# Patient Record
Sex: Female | Born: 1990 | Race: White | Hispanic: No | Marital: Married | Smoking: Never smoker
Health system: Southern US, Community
[De-identification: ages and names within clinical notes are randomized; demographics above are authoritative.]

## PROBLEM LIST (undated history)

## (undated) DIAGNOSIS — J309 Allergic rhinitis, unspecified: Secondary | ICD-10-CM

## (undated) DIAGNOSIS — E119 Type 2 diabetes mellitus without complications: Secondary | ICD-10-CM

## (undated) DIAGNOSIS — O24419 Gestational diabetes mellitus in pregnancy, unspecified control: Secondary | ICD-10-CM

## (undated) DIAGNOSIS — M549 Dorsalgia, unspecified: Secondary | ICD-10-CM

## (undated) DIAGNOSIS — F988 Other specified behavioral and emotional disorders with onset usually occurring in childhood and adolescence: Secondary | ICD-10-CM

## (undated) DIAGNOSIS — J45909 Unspecified asthma, uncomplicated: Secondary | ICD-10-CM

## (undated) HISTORY — DX: Type 2 diabetes mellitus without complications: E11.9

## (undated) HISTORY — PX: WISDOM TOOTH EXTRACTION: SHX21

## (undated) HISTORY — PX: NO PAST SURGERIES: SHX2092

## (undated) HISTORY — DX: Allergic rhinitis, unspecified: J30.9

## (undated) HISTORY — DX: Gestational diabetes mellitus in pregnancy, unspecified control: O24.419

## (undated) HISTORY — DX: Dorsalgia, unspecified: M54.9

## (undated) HISTORY — DX: Unspecified asthma, uncomplicated: J45.909

---
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
## 2012-02-28 ENCOUNTER — Other Ambulatory Visit: Payer: Self-pay

## 2012-02-28 MED ORDER — DROSPIRENONE-ETHINYL ESTRADIOL 3-0.03 MG PO TABS: 1.0000 | ORAL_TABLET | Freq: Every day | ORAL | Status: DC

## 2012-02-28 NOTE — Telephone Encounter (Signed)
pts mother called insurance will no longer cover local pharmacy and needs Yasmin transferred to [facility]. Yasmin 3-0.03 mg #30 x 11 sent electronically to [facility]. pts mother advised while on phone.

## 2012-03-02 ENCOUNTER — Other Ambulatory Visit: Payer: Self-pay | Admitting: *Deleted

## 2012-03-02 NOTE — Telephone Encounter (Signed)
Yasmin recently refilled for one [place] with 11 refills, should have been 3 packs with 3 refills.  Form to change this is on your desk.

## 2012-03-03 NOTE — Telephone Encounter (Signed)
Form faxed

## 2012-03-03 NOTE — Telephone Encounter (Signed)
In my box

## 2012-04-27 ENCOUNTER — Encounter: Payer: Self-pay | Admitting: Family Medicine

## 2012-04-27 ENCOUNTER — Ambulatory Visit (INDEPENDENT_AMBULATORY_CARE_PROVIDER_SITE_OTHER): Payer: BC Managed Care – PPO | Admitting: Family Medicine

## 2012-04-27 VITALS — BP 108/70 | HR 80 | Temp 98.1°F | Wt 213.5 lb

## 2012-04-27 DIAGNOSIS — L0291 Cutaneous abscess, unspecified: Secondary | ICD-10-CM | POA: Insufficient documentation

## 2012-04-27 DIAGNOSIS — L0390 Cellulitis, unspecified: Secondary | ICD-10-CM

## 2012-04-27 MED ORDER — SULFAMETHOXAZOLE-TRIMETHOPRIM 800-160 MG PO TABS: 1.0000 | ORAL_TABLET | Freq: Two times a day (BID) | ORAL | Status: AC

## 2012-04-27 NOTE — Patient Instructions (Addendum)
You have an abscess. Please apply warm compresses as many a day as possible. Take antibiotic as directed. Call me immediately- if gets bigger, if you develop a fever, if you nauseated.

## 2012-04-27 NOTE — Progress Notes (Signed)
  Subjective:    Patient ID: Vanessa Merritt, female    DOB: 1991/05/21, 21 y.o.   MRN: [id number]  HPI  21 yo here for ? Infected bug bite on her right inguinal area.  Started approx 3 weeks ago- appeared like a normal bug bite. Over past week or so, getting larger, painful and erythematous. No drainage.  No fevers, no chills, no nausea or vomiting.  She has never had anything like this in past.  Patient Active Problem List  Diagnosis  . MENORRHAGIA  . BACK PAIN, CHRONIC, INTERMITTENT  . Routine general medical examination at a health care facility  . Abscess   Past Medical History  Diagnosis Date  . Back pain    No past surgical history on file. History  Substance Use Topics  . Smoking status: Never Smoker   . Smokeless tobacco: Not on file  . Alcohol Use: No   Family History  Problem Relation Age of Onset  . Hypothyroidism Mother   . Hyperlipidemia Mother   . Hypertension Father    No Known Allergies Current Outpatient Prescriptions on File Prior to Visit  Medication Sig Dispense Refill  . drospirenone-ethinyl estradiol (YASMIN,ZARAH,SYEDA) 3-0.03 MG tablet Take 1 tablet by mouth daily.  1 Package  11   The PMH, PSH, Social History, Family History, Medications, and allergies have been reviewed in [facility], and have been updated if relevant.   Review of Systems See HPI    Objective:   Physical Exam BP 108/70  Pulse 80  Temp 98.1 F (36.7 C) (Oral)  Wt 213 lb 8 oz (96.843 kg)  LMP 04/05/2012 Gen:  Alert pleasant, NAD Skin:  1.5 cm raised, non fluctuant abscess on right inguinal area, mild erythema and warmth.    Assessment & Plan:   1. Abscess    New- non fluctuant so will not I and D today. Start Bactrim DS- 1 tab po twice daily x 7 days. Warm compresses. Discussed red flag symptoms- see pt instructions for details.

## 2012-10-27 ENCOUNTER — Other Ambulatory Visit: Payer: Self-pay | Admitting: Family Medicine

## 2012-10-27 NOTE — Telephone Encounter (Signed)
Pt left vm stating that she was unable to get her yasmin filled at pharmacy.  RX written 02/28/12 x11 refills.  Left message for pt to call back.

## 2013-06-07 ENCOUNTER — Other Ambulatory Visit: Payer: BC Managed Care – PPO

## 2013-06-14 ENCOUNTER — Encounter: Payer: BC Managed Care – PPO | Admitting: Family Medicine

## 2013-08-06 ENCOUNTER — Other Ambulatory Visit: Payer: Self-pay | Admitting: Family Medicine

## 2013-08-06 DIAGNOSIS — Z136 Encounter for screening for cardiovascular disorders: Secondary | ICD-10-CM

## 2013-08-06 DIAGNOSIS — Z0000 Encounter for general adult medical examination without abnormal findings: Secondary | ICD-10-CM

## 2013-08-16 ENCOUNTER — Other Ambulatory Visit (INDEPENDENT_AMBULATORY_CARE_PROVIDER_SITE_OTHER): Payer: BC Managed Care – PPO

## 2013-08-16 DIAGNOSIS — Z136 Encounter for screening for cardiovascular disorders: Secondary | ICD-10-CM

## 2013-08-16 DIAGNOSIS — Z0000 Encounter for general adult medical examination without abnormal findings: Secondary | ICD-10-CM

## 2013-08-16 LAB — CBC WITH DIFFERENTIAL/PLATELET
Basophils Relative: 0.1 % (ref 0.0–3.0)
HCT: 33.6 % — ABNORMAL LOW (ref 36.0–46.0)
Hemoglobin: 11.5 g/dL — ABNORMAL LOW (ref 12.0–15.0)
Lymphocytes Relative: 22.1 % (ref 12.0–46.0)
Monocytes Relative: 8.4 % (ref 3.0–12.0)
Neutro Abs: 7.3 10*3/uL (ref 1.4–7.7)
RBC: 3.99 Mil/uL (ref 3.87–5.11)

## 2013-08-16 LAB — LIPID PANEL
Cholesterol: 173 mg/dL (ref 0–200)
LDL Cholesterol: 113 mg/dL — ABNORMAL HIGH (ref 0–99)
Total CHOL/HDL Ratio: 4

## 2013-08-16 LAB — COMPREHENSIVE METABOLIC PANEL
AST: 21 U/L (ref 0–37)
Albumin: 3.9 g/dL (ref 3.5–5.2)
Alkaline Phosphatase: 73 U/L (ref 39–117)
Calcium: 9.3 mg/dL (ref 8.4–10.5)
Chloride: 106 mEq/L (ref 96–112)
Glucose, Bld: 95 mg/dL (ref 70–99)
Potassium: 3.9 mEq/L (ref 3.5–5.1)
Sodium: 140 mEq/L (ref 135–145)
Total Protein: 7.5 g/dL (ref 6.0–8.3)

## 2013-08-23 ENCOUNTER — Encounter: Payer: BC Managed Care – PPO | Admitting: Family Medicine

## 2014-03-30 ENCOUNTER — Encounter: Payer: Self-pay | Admitting: Family Medicine

## 2014-03-30 ENCOUNTER — Ambulatory Visit (INDEPENDENT_AMBULATORY_CARE_PROVIDER_SITE_OTHER): Payer: BC Managed Care – PPO | Admitting: Family Medicine

## 2014-03-30 VITALS — BP 110/74 | HR 79 | Temp 98.0°F | Ht 66.5 in | Wt 233.0 lb

## 2014-03-30 DIAGNOSIS — M25562 Pain in left knee: Principal | ICD-10-CM

## 2014-03-30 DIAGNOSIS — E669 Obesity, unspecified: Secondary | ICD-10-CM

## 2014-03-30 DIAGNOSIS — M25561 Pain in right knee: Secondary | ICD-10-CM | POA: Insufficient documentation

## 2014-03-30 DIAGNOSIS — M25569 Pain in unspecified knee: Secondary | ICD-10-CM

## 2014-03-30 NOTE — Patient Instructions (Signed)
Great to see you. Trying walking, biking and swimming.  Let me know if you want a referral to a nutritionist.  Call me if things get worse- like the symptoms we talked about.

## 2014-03-30 NOTE — Progress Notes (Signed)
   Subjective:   Patient ID: Vanessa Merritt, female    DOB: 1990-12-14, 23 y.o.   MRN: [id number]  Vanessa Merritt is a pleasant 23 y.o. year old female who presents to clinic today with Knee Pain  on 03/30/2014  HPI: 2 months of bilateral knee locking and popping.  Mainly feels it after she has been sitting for long periods of time.  Does not wake her up at night. No knee redness or swelling. No known injury.  Has gained some weight.  Wt Readings from Last 3 Encounters:  03/30/14 233 lb (105.688 kg)  04/27/12 213 lb 8 oz (96.843 kg) (98%*, Z = 2.13)  04/08/11 208 lb 8 oz (94.575 kg) (98%*, Z = 2.09)   * Growth percentiles are based on CDC 2-20 Years data.    No recent new exercises.  Current Outpatient Prescriptions on File Prior to Visit  Medication Sig Dispense Refill  . drospirenone-ethinyl estradiol (YASMIN,ZARAH,SYEDA) 3-0.03 MG tablet Take 1 tablet by mouth daily.  1 Package  11   No current facility-administered medications on file prior to visit.    No Known Allergies  Past Medical History  Diagnosis Date  . Back pain     No past surgical history on file.  Family History  Problem Relation Age of Onset  . Hypothyroidism Mother   . Hyperlipidemia Mother   . Hypertension Father     History   Social History  . Marital Status: Single    Spouse Name: N/A    Number of Children: N/A  . Years of Education: N/A   Occupational History  . Not on file.   Social History Main Topics  . Smoking status: Never Smoker   . Smokeless tobacco: Not on file  . Alcohol Use: No  . Drug Use: No  . Sexual Activity: Not on file   Other Topics Concern  . Not on file   Social History Narrative   Lives with mom and dad   Wants to go into Elementary education   The PMH, PSH, Social History, Family History, Medications, and allergies have been reviewed in [facility], and have been updated if relevant.   Review of Systems See HPI    Objective:    BP 110/74  Pulse 79   Temp(Src) 98 F (36.7 C) (Oral)  Ht 5' 6.5" (1.689 m)  Wt 233 lb (105.688 kg)  BMI 37.05 kg/m2  SpO2 98%  LMP 03/29/2014 Wt Readings from Last 3 Encounters:  03/30/14 233 lb (105.688 kg)  04/27/12 213 lb 8 oz (96.843 kg) (98%*, Z = 2.13)  04/08/11 208 lb 8 oz (94.575 kg) (98%*, Z = 2.09)   * Growth percentiles are based on CDC 2-20 Years data.      Physical Exam Gen:  Obese, pleasant, NAD MSK: Bilateral knees normal to inspection and palpation No laxity Neg anterior drawer No effusion    Assessment & Plan:   Knee pain, bilateral No Follow-up on file.

## 2014-03-30 NOTE — Assessment & Plan Note (Signed)
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 Discussed exercises and weight loss. Call or return to clinic prn if these symptoms worsen or fail to improve as anticipated. The patient indicates understanding of these issues and agrees with the plan.

## 2014-03-30 NOTE — Assessment & Plan Note (Signed)
Declined nutrition referral.

## 2014-03-30 NOTE — Progress Notes (Signed)
Pre visit review using our clinic review tool, if applicable. No additional management support is needed unless otherwise documented below in the visit note. 

## 2014-04-06 ENCOUNTER — Ambulatory Visit: Payer: BC Managed Care – PPO | Admitting: Family Medicine

## 2014-04-21 ENCOUNTER — Ambulatory Visit (INDEPENDENT_AMBULATORY_CARE_PROVIDER_SITE_OTHER): Payer: BC Managed Care – PPO | Admitting: Family Medicine

## 2014-04-21 ENCOUNTER — Encounter: Payer: Self-pay | Admitting: Family Medicine

## 2014-04-21 VITALS — BP 114/72 | HR 79 | Temp 98.1°F | Ht 66.5 in | Wt 229.2 lb

## 2014-04-21 DIAGNOSIS — Z23 Encounter for immunization: Secondary | ICD-10-CM

## 2014-04-21 DIAGNOSIS — Z136 Encounter for screening for cardiovascular disorders: Secondary | ICD-10-CM

## 2014-04-21 DIAGNOSIS — Z0000 Encounter for general adult medical examination without abnormal findings: Secondary | ICD-10-CM

## 2014-04-21 LAB — LIPID PANEL
CHOL/HDL RATIO: 5
Cholesterol: 185 mg/dL (ref 0–200)
HDL: 38.6 mg/dL — AB (ref >39.00)
LDL CALC: 125 mg/dL — AB (ref 0–99)
NonHDL: 146.4
TRIGLYCERIDES: 105 mg/dL (ref 0.0–149.0)
VLDL: 21 mg/dL (ref 0.0–40.0)

## 2014-04-21 LAB — COMPREHENSIVE METABOLIC PANEL
ALBUMIN: 4.1 g/dL (ref 3.5–5.2)
ALK PHOS: 59 U/L (ref 39–117)
ALT: 17 U/L (ref 0–35)
AST: 17 U/L (ref 0–37)
BILIRUBIN TOTAL: 0.6 mg/dL (ref 0.2–1.2)
BUN: 13 mg/dL (ref 6–23)
CO2: 27 mEq/L (ref 19–32)
Calcium: 9.1 mg/dL (ref 8.4–10.5)
Chloride: 104 mEq/L (ref 96–112)
Creatinine, Ser: 0.6 mg/dL (ref 0.4–1.2)
GFR: 129.29 mL/min (ref >60.00)
Glucose, Bld: 91 mg/dL (ref 70–99)
POTASSIUM: 3.8 meq/L (ref 3.5–5.1)
Sodium: 136 mEq/L (ref 135–145)
Total Protein: 7.4 g/dL (ref 6.0–8.3)

## 2014-04-21 LAB — CBC WITH DIFFERENTIAL/PLATELET
BASOS PCT: 0.3 % (ref 0.0–3.0)
Basophils Absolute: 0 10*3/uL (ref 0.0–0.1)
EOS ABS: 0.2 10*3/uL (ref 0.0–0.7)
Eosinophils Relative: 1.6 % (ref 0.0–5.0)
HCT: 37.3 % (ref 36.0–46.0)
Hemoglobin: 12.6 g/dL (ref 12.0–15.0)
Lymphocytes Relative: 20.1 % (ref 12.0–46.0)
Lymphs Abs: 2 10*3/uL (ref 0.7–4.0)
MCHC: 33.7 g/dL (ref 30.0–36.0)
MCV: 85.3 fl (ref 78.0–100.0)
MONO ABS: 0.7 10*3/uL (ref 0.1–1.0)
Monocytes Relative: 7.6 % (ref 3.0–12.0)
Neutro Abs: 6.9 10*3/uL (ref 1.4–7.7)
Neutrophils Relative %: 70.4 % (ref 43.0–77.0)
PLATELETS: 326 10*3/uL (ref 150.0–400.0)
RBC: 4.37 Mil/uL (ref 3.87–5.11)
RDW: 13 % (ref 11.5–14.6)
WBC: 9.8 10*3/uL (ref 4.5–10.5)

## 2014-04-21 LAB — VITAMIN B12: Vitamin B-12: 315 pg/mL (ref 211–911)

## 2014-04-21 LAB — TSH: TSH: 2.29 u[IU]/mL (ref 0.35–5.50)

## 2014-04-21 NOTE — Assessment & Plan Note (Signed)
Discussed dangers of smoking, alcohol, and drug abuse.  Also discussed sexual activity, pregnancy risk, and STD risk.  Encouraged to get regular exercise.  Declined STD screening.  Gardasil #2 today.  Advised pap after 05/2014.

## 2014-04-21 NOTE — Patient Instructions (Addendum)
Great to see you. You can look up your labs online and you can email me with questions. We will call you with your lab results.

## 2014-04-21 NOTE — Progress Notes (Signed)
Subjective:   Patient ID: Vanessa Merritt, female    DOB: 11/14/1990, 23 y.o.   MRN: [id number]  Vanessa SanesCara N Merritt is a pleasant 23 y.o. year old female who presents to clinic today with Annual Exam  on 04/21/2014  HPI: 23 yo GO here for CPX.  Has never had a pap smear.  Denies any current complaints. Knee pain has improved.  Not currently sexually active.  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  Current Outpatient Prescriptions on File Prior to Visit  Medication Sig Dispense Refill  . doxycycline (DORYX) 75 MG EC tablet Take 75 mg by mouth 2 (two) times daily.      . drospirenone-ethinyl estradiol (YASMIN,ZARAH,SYEDA) 3-0.03 MG tablet Take 1 tablet by mouth daily.  1 Package  11   No current facility-administered medications on file prior to visit.    No Known Allergies  Past Medical History  Diagnosis Date  . Back pain     No past surgical history on file.  Family History  Problem Relation Age of Onset  . Hypothyroidism Mother   . Hyperlipidemia Mother   . Hypertension Father     History   Social History  . Marital Status: Single    Spouse Name: N/A    Number of Children: N/A  . Years of Education: N/A   Occupational History  . Not on file.   Social History Main Topics  . Smoking status: Never Smoker   . Smokeless tobacco: Not on file  . Alcohol Use: No  . Drug Use: No  . Sexual Activity: Not on file   Other Topics Concern  . Not on file   Social History Narrative   Lives with mom and dad   Wants to go into Elementary education   The PMH, PSH, Social History, Family History, Medications, and allergies [facility], and have been updated if relevant.   Review of Systems    See HPI Patient reports no  vision/ hearing changes,anorexia, weight change, fever ,adenopathy, persistant / recurrent hoarseness, swallowing issues, chest pain, edema,persistant / recurrent cough, hemoptysis, dyspnea(rest, exertional, paroxysmal nocturnal),  gastrointestinal  bleeding (melena, rectal bleeding), abdominal pain, excessive heart burn, GU symptoms(dysuria, hematuria, pyuria, voiding/incontinence  Issues) syncope, focal weakness, severe memory loss, concerning skin lesions, depression, anxiety, abnormal bruising/bleeding, major joint swelling, breast masses or abnormal vaginal bleeding.    Objective:    BP 114/72  Pulse 79  Temp(Src) 98.1 F (36.7 C) (Oral)  Ht 5' 6.5" (1.689 m)  Wt 229 lb 4 oz (103.987 kg)  BMI 36.45 kg/m2  SpO2 97%  LMP 03/29/2014  Wt Readings from Last 3 Encounters:  04/21/14 229 lb 4 oz (103.987 kg)  03/30/14 233 lb (105.688 kg)  04/27/12 213 lb 8 oz (96.843 kg) (98%*, Z = 2.13)   * Growth percentiles are based on CDC 2-20 Years data.    Physical Exam    General:  Well-developed,well-nourished,in no acute distress; alert,appropriate and cooperative throughout examination Head:  normocephalic and atraumatic.   Eyes:  vision grossly intact, pupils equal, pupils round, and pupils reactive to light.   Ears:  R ear normal and L ear normal.   Nose:  no external deformity.   Mouth:  good dentition.   Neck:  No deformities, masses, or tenderness noted. Breasts:  No mass, nodules, thickening, tenderness, bulging, retraction, inflamation, nipple discharge or skin changes noted.   Lungs:  Normal respiratory effort, chest expands symmetrically. Lungs are clear to auscultation, no crackles  or wheezes. Heart:  Normal rate and regular rhythm. S1 and S2 normal without gallop, murmur, click, rub or other extra sounds. Abdomen:  Bowel sounds positive,abdomen soft and non-tender without masses, organomegaly or hernias noted. sis noted of thoracic or lumbar spine.   Extremities:  No clubbing, cyanosis, edema, or deformity noted with normal full range of motion of all joints.   Neurologic:  alert & oriented X3 and gait normal.   Skin:  Intact without suspicious lesions or rashes Cervical Nodes:  No lymphadenopathy  noted Axillary Nodes:  No palpable lymphadenopathy Psych:  Cognition and judgment appear intact. Alert and cooperative with normal attention span and concentration. No apparent delusions, illusions, hallucinations      Assessment & Plan:   Routine general medical examination at a health care facility No Follow-up on file.

## 2014-04-21 NOTE — Progress Notes (Signed)
Pre visit review using our clinic review tool, if applicable. No additional management support is needed unless otherwise documented below in the visit note. 

## 2014-04-21 NOTE — Addendum Note (Signed)
Addended by: Desmond DikeKNIGHT, WAYNETTA H on: 04/21/2014 07:56 AM   Modules accepted: Orders

## 2014-04-22 ENCOUNTER — Encounter: Payer: Self-pay | Admitting: *Deleted

## 2014-06-13 ENCOUNTER — Encounter: Payer: Self-pay | Admitting: Gynecology

## 2014-10-11 ENCOUNTER — Ambulatory Visit (INDEPENDENT_AMBULATORY_CARE_PROVIDER_SITE_OTHER): Payer: BC Managed Care – PPO | Admitting: Family Medicine

## 2014-10-11 ENCOUNTER — Encounter: Payer: Self-pay | Admitting: Family Medicine

## 2014-10-11 VITALS — BP 136/76 | HR 78 | Temp 98.5°F | Wt 234.5 lb

## 2014-10-11 DIAGNOSIS — R05 Cough: Secondary | ICD-10-CM

## 2014-10-11 DIAGNOSIS — R053 Chronic cough: Secondary | ICD-10-CM | POA: Insufficient documentation

## 2014-10-11 DIAGNOSIS — R059 Cough, unspecified: Secondary | ICD-10-CM

## 2014-10-11 MED ORDER — ALBUTEROL SULFATE HFA 108 (90 BASE) MCG/ACT IN AERS: 1.0000 | INHALATION_SPRAY | Freq: Four times a day (QID) | RESPIRATORY_TRACT | Status: DC | PRN

## 2014-10-11 MED ORDER — BENZONATATE 200 MG PO CAPS: 200.0000 mg | ORAL_CAPSULE | Freq: Three times a day (TID) | ORAL | Status: DC | PRN

## 2014-10-11 NOTE — Assessment & Plan Note (Signed)
Ctab, looks to be post infectious.  D/w pt.   Use SABA prn, then tessalon if needed. If not improved, then notify us.  She agrees. Nontoxic.

## 2014-10-11 NOTE — Patient Instructions (Signed)
Use the inhaler first and if not better then try tessalon.  If not better at all, then notify us.  This should gradually get better.  Your lungs are clear and it looks like you have a post-infectious cough.

## 2014-10-11 NOTE — Progress Notes (Signed)
Pre visit review using our clinic review tool, if applicable. No additional management support is needed unless otherwise documented below in the visit note.  Coughing since mid October.  She started with a series of allergies/sinus infections, dx'd at student health.  She was put on abx twice.   She had facial pain/pressure but that is resolved.  She had some aches prev, but those are resolved.   Still with cough, but getting better very slowly.  Nights are worse than days with the cough.   Cough is dry.  She prev had sputum but that is resolved.  No fevers now or prev.   Some wheeze prev, not now.   No ST now, just irritated from coughing.  Chest is irritated from coughing.   No ear pain or facial pain now.   Prev with post nasal drip, not now.   H/o childhood asthma, no sx recently.  No inhaler use in years.   Meds, vitals, and allergies reviewed.   ROS: See HPI.  Otherwise, noncontributory.  GEN: nad, alert and oriented HEENT: mucous membranes moist, tm w/o erythema, nasal exam w/o erythema, clear discharge noted,  OP with mild cobblestoning NECK: supple w/o LA CV: rrr.   PULM: ctab, no inc wob, dry cough noted.  EXT: no edema SKIN: no acute rash

## 2015-08-30 ENCOUNTER — Encounter: Payer: Self-pay | Admitting: Family Medicine

## 2015-08-30 ENCOUNTER — Ambulatory Visit (INDEPENDENT_AMBULATORY_CARE_PROVIDER_SITE_OTHER): Payer: BLUE CROSS/BLUE SHIELD | Admitting: Family Medicine

## 2015-08-30 VITALS — BP 122/70 | HR 91 | Temp 98.6°F | Ht 66.75 in | Wt 238.2 lb

## 2015-08-30 DIAGNOSIS — Z0000 Encounter for general adult medical examination without abnormal findings: Secondary | ICD-10-CM | POA: Diagnosis not present

## 2015-08-30 DIAGNOSIS — N920 Excessive and frequent menstruation with regular cycle: Secondary | ICD-10-CM

## 2015-08-30 DIAGNOSIS — E669 Obesity, unspecified: Secondary | ICD-10-CM

## 2015-08-30 LAB — CBC WITH DIFFERENTIAL/PLATELET
Basophils Absolute: 0 10*3/uL (ref 0.0–0.1)
Basophils Relative: 0.5 % (ref 0.0–3.0)
EOS ABS: 0.1 10*3/uL (ref 0.0–0.7)
Eosinophils Relative: 1.4 % (ref 0.0–5.0)
HCT: 37.8 % (ref 36.0–46.0)
HEMOGLOBIN: 12.5 g/dL (ref 12.0–15.0)
Lymphocytes Relative: 17.5 % (ref 12.0–46.0)
Lymphs Abs: 1.3 10*3/uL (ref 0.7–4.0)
MCHC: 33 g/dL (ref 30.0–36.0)
MCV: 84.1 fl (ref 78.0–100.0)
MONO ABS: 0.9 10*3/uL (ref 0.1–1.0)
Monocytes Relative: 12.7 % — ABNORMAL HIGH (ref 3.0–12.0)
NEUTROS PCT: 67.9 % (ref 43.0–77.0)
Neutro Abs: 5 10*3/uL (ref 1.4–7.7)
Platelets: 354 10*3/uL (ref 150.0–400.0)
RBC: 4.49 Mil/uL (ref 3.87–5.11)
RDW: 13 % (ref 11.5–15.5)
WBC: 7.4 10*3/uL (ref 4.0–10.5)

## 2015-08-30 LAB — TSH: TSH: 1.29 u[IU]/mL (ref 0.35–4.50)

## 2015-08-30 LAB — COMPREHENSIVE METABOLIC PANEL
ALK PHOS: 79 U/L (ref 39–117)
ALT: 13 U/L (ref 0–35)
AST: 15 U/L (ref 0–37)
Albumin: 3.9 g/dL (ref 3.5–5.2)
BUN: 8 mg/dL (ref 6–23)
CHLORIDE: 103 meq/L (ref 96–112)
CO2: 27 mEq/L (ref 19–32)
Calcium: 9.3 mg/dL (ref 8.4–10.5)
Creatinine, Ser: 0.72 mg/dL (ref 0.40–1.20)
GFR: 107.42 mL/min (ref >60.00)
GLUCOSE: 78 mg/dL (ref 70–99)
Potassium: 4.3 mEq/L (ref 3.5–5.1)
SODIUM: 136 meq/L (ref 135–145)
TOTAL PROTEIN: 7.4 g/dL (ref 6.0–8.3)
Total Bilirubin: 0.4 mg/dL (ref 0.2–1.2)

## 2015-08-30 LAB — LIPID PANEL
CHOL/HDL RATIO: 5
CHOLESTEROL: 160 mg/dL (ref 0–200)
HDL: 35.4 mg/dL — ABNORMAL LOW (ref >39.00)
LDL CALC: 112 mg/dL — AB (ref 0–99)
NONHDL: 124.27
Triglycerides: 62 mg/dL (ref 0.0–149.0)
VLDL: 12.4 mg/dL (ref 0.0–40.0)

## 2015-08-30 MED ORDER — ALBUTEROL SULFATE HFA 108 (90 BASE) MCG/ACT IN AERS: 1.0000 | INHALATION_SPRAY | Freq: Four times a day (QID) | RESPIRATORY_TRACT | Status: DC | PRN

## 2015-08-30 NOTE — Progress Notes (Signed)
Subjective:   Patient ID: Vanessa Merritt, female    DOB: 02/20/1991, 24 y.o.   MRN: [id number]  Vanessa SanesCara N Merritt is a pleasant 24 y.o. year old female who presents to clinic today with Annual Exam  on 08/30/2015  HPI: 24 yo pleasant G0 here for CPX.  Denies any current complaints. Knee pain has improved.  Not currently sexually active.  Has never had a pap smear.  Does enjoy teaching.  No current outpatient prescriptions on file prior to visit.   No current facility-administered medications on file prior to visit.    No Known Allergies  Past Medical History  Diagnosis Date  . Back pain     No past surgical history on file.  Family History  Problem Relation Age of Onset  . Hypothyroidism Mother   . Hyperlipidemia Mother   . Hypertension Father     Social History   Social History  . Marital Status: Single    Spouse Name: N/A  . Number of Children: N/A  . Years of Education: N/A   Occupational History  . Not on file.   Social History Main Topics  . Smoking status: Never Smoker   . Smokeless tobacco: Not on file  . Alcohol Use: No  . Drug Use: No  . Sexual Activity: Not on file   Other Topics Concern  . Not on file   Social History Narrative   Teacher   The PMH, PSH, Social History, Family History, Medications, and allergies have [facility], and have been updated if relevant.   Review of Systems  Constitutional: Negative.   HENT: Negative.   Respiratory: Negative.   Cardiovascular: Negative.   Gastrointestinal: Negative.   Endocrine: Negative.   Genitourinary: Negative.   Musculoskeletal: Negative.   Skin: Negative.   Allergic/Immunologic: Negative.   Neurological: Negative.   Hematological: Negative.   Psychiatric/Behavioral: Negative.   All other systems reviewed and are negative.       Objective:    BP 122/70 mmHg  Pulse 91  Temp(Src) 98.6 F (37 C) (Oral)  Ht 5' 6.75" (1.695 m)  Wt 238 lb 4 oz (108.069 kg)  BMI 37.62  kg/m2  SpO2 95%  LMP 08/14/2015  Wt Readings from Last 3 Encounters:  08/30/15 238 lb 4 oz (108.069 kg)  10/11/14 234 lb 8 oz (106.369 kg)  04/21/14 229 lb 4 oz (103.987 kg)    Physical Exam    General:  Well-developed,well-nourished,in no acute distress; alert,appropriate and cooperative throughout examination Head:  normocephalic and atraumatic.   Eyes:  vision grossly intact, pupils equal, pupils round, and pupils reactive to light.   Ears:  R ear normal and L ear normal.   Nose:  no external deformity.   Mouth:  good dentition.   Neck:  No deformities, masses, or tenderness noted. Breasts:  No mass, nodules, thickening, tenderness, bulging, retraction, inflamation, nipple discharge or skin changes noted.   Lungs:  Normal respiratory effort, chest expands symmetrically. Lungs are clear to auscultation, no crackles or wheezes. Heart:  Normal rate and regular rhythm. S1 and S2 normal without gallop, murmur, click, rub or other extra sounds. Abdomen:  Bowel sounds positive,abdomen soft and non-tender without masses, organomegaly or hernias noted. sis noted of thoracic or lumbar spine.   Extremities:  No clubbing, cyanosis, edema, or deformity noted with normal full range of motion of all joints.   Neurologic:  alert & oriented X3 and gait normal.   Skin:  Intact without  suspicious lesions or rashes Cervical Nodes:  No lymphadenopathy noted Axillary Nodes:  No palpable lymphadenopathy Psych:  Cognition and judgment appear intact. Alert and cooperative with normal attention span and concentration. No apparent delusions, illusions, hallucinations      Assessment & Plan:   Routine general medical examination at a health care facility  MENORRHAGIA  Obesity, unspecified No Follow-up on file.

## 2015-08-30 NOTE — Assessment & Plan Note (Signed)
Reviewed preventive care protocols, scheduled due services, and updated immunizations Discussed nutrition, exercise, diet, and healthy lifestyle.  Having labs done today.  Declines pap smear today.

## 2015-08-30 NOTE — Progress Notes (Signed)
Pre visit review using our clinic review tool, if applicable. No additional management support is needed unless otherwise documented below in the visit note. 

## 2015-08-30 NOTE — Addendum Note (Signed)
Addended by: Dianne DunARON, TALIA M on: 08/30/2015 12:37 PM   Modules accepted: Orders, SmartSet

## 2015-08-30 NOTE — Patient Instructions (Signed)
Great to see you. Please call Vanessa Merritt back to schedule a pap smear /pelvic exam.

## 2015-08-31 ENCOUNTER — Encounter: Payer: Self-pay | Admitting: *Deleted

## 2015-12-20 ENCOUNTER — Ambulatory Visit (INDEPENDENT_AMBULATORY_CARE_PROVIDER_SITE_OTHER): Payer: BLUE CROSS/BLUE SHIELD | Admitting: *Deleted

## 2015-12-20 ENCOUNTER — Telehealth: Payer: Self-pay | Admitting: Family Medicine

## 2015-12-20 DIAGNOSIS — Z111 Encounter for screening for respiratory tuberculosis: Secondary | ICD-10-CM | POA: Diagnosis not present

## 2015-12-20 NOTE — Telephone Encounter (Signed)
Will defer this to [facility].  Please see below.

## 2015-12-20 NOTE — Telephone Encounter (Signed)
Yes, you are able to schedule her a TB skin test today, if there is are any open nurse visits

## 2015-12-20 NOTE — Telephone Encounter (Signed)
Patient scheduled tb test at 4:00 today.  Patient rescheduled appointment to fill out form to [employer] at 3:45 tomorrow.  Patient couldn't come in at 12:15.

## 2015-12-20 NOTE — Telephone Encounter (Signed)
Patient scheduled appointment for tomorrow with Dr.Aron to get a form filled out for work.  Patient,also,needs a tb test done.  Since patient can't get tb test tomorrow, can she get her tb test done today and have it read on Friday?  Please call patient back this afternoon.

## 2015-12-21 ENCOUNTER — Ambulatory Visit: Payer: BLUE CROSS/BLUE SHIELD | Admitting: Family Medicine

## 2015-12-21 ENCOUNTER — Encounter: Payer: Self-pay | Admitting: Internal Medicine

## 2015-12-21 ENCOUNTER — Ambulatory Visit (INDEPENDENT_AMBULATORY_CARE_PROVIDER_SITE_OTHER): Payer: BLUE CROSS/BLUE SHIELD | Admitting: Internal Medicine

## 2015-12-21 VITALS — BP 128/80 | HR 79 | Temp 98.9°F | Wt 236.0 lb

## 2015-12-21 DIAGNOSIS — Z0289 Encounter for other administrative examinations: Secondary | ICD-10-CM | POA: Diagnosis not present

## 2015-12-21 NOTE — Progress Notes (Signed)
Pre visit review using our clinic review tool, if applicable. No additional management support is needed unless otherwise documented below in the visit note. 

## 2015-12-21 NOTE — Progress Notes (Signed)
Subjective:    Patient ID: Vanessa Merritt, female    DOB: 1990-11-25, 25 y.o.   MRN: [id number]  HPI  Pt presents to the clinic today to have a form filled out for her employer. She had her annual exam with Dr. Dayton Martes 08/2015. She had her TB test done yesterday, and will have it read tomorrow.   Review of Systems      Past Medical History  Diagnosis Date  . Back pain     Current Outpatient Prescriptions  Medication Sig Dispense Refill  . albuterol (PROVENTIL HFA;VENTOLIN HFA) 108 (90 BASE) MCG/ACT inhaler Inhale 1-2 puffs into the lungs every 6 (six) hours as needed (cough). 1 Inhaler 6   No current facility-administered medications for this visit.    No Known Allergies  Family History  Problem Relation Age of Onset  . Hypothyroidism Mother   . Hyperlipidemia Mother   . Hypertension Father     Social History   Social History  . Marital Status: Single    Spouse Name: N/A  . Number of Children: N/A  . Years of Education: N/A   Occupational History  . Not on file.   Social History Main Topics  . Smoking status: Never Smoker   . Smokeless tobacco: Not on file  . Alcohol Use: No  . Drug Use: No  . Sexual Activity: Not on file   Other Topics Concern  . 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  HEENT: Denies eye pain, eye redness, ear pain, ringing in the ears, wax buildup, runny nose, nasal congestion, bloody nose, or sore throat. Respiratory: Denies difficulty breathing, shortness of breath, cough or sputum production.   Cardiovascular: Denies chest pain, chest tightness, palpitations or swelling in the hands or feet.  Gastrointestinal: Denies abdominal pain, bloating, constipation, diarrhea or blood in the stool.  GU: Denies urgency, frequency, pain with urination, burning sensation, blood in urine, odor or discharge. Musculoskeletal: Denies decrease in range of  motion, difficulty with gait, muscle pain or joint pain and swelling.  Skin: Denies redness, rashes, lesions or ulcercations.  Neurological: Denies dizziness, difficulty with memory, difficulty with speech or problems with balance and coordination.  Psych: Denies anxiety, depression, SI/HI.  No other specific complaints in a complete review of systems (except as listed in HPI above).  Objective:   Physical Exam  BP 128/80 mmHg  Pulse 79  Temp(Src) 98.9 F (37.2 C) (Oral)  Wt 236 lb (107.049 kg)  SpO2 98% Wt Readings from Last 3 Encounters:  12/21/15 236 lb (107.049 kg)  08/30/15 238 lb 4 oz (108.069 kg)  10/11/14 234 lb 8 oz (106.369 kg)    General: Appears her stated age, obese in NAD.  Cardiovascular: Normal rate and rhythm. S1,S2 noted.  No murmur, rubs or gallops noted.  Pulmonary/Chest: Normal effort and positive vesicular breath sounds. No respiratory distress. No wheezes, rales or ronchi noted.    BMET    Component Value Date/Time   NA 136 08/30/2015 1238   K 4.3 08/30/2015 1238   CL 103 08/30/2015 1238   CO2 27 08/30/2015 1238   GLUCOSE 78 08/30/2015 1238   BUN 8 08/30/2015 1238   CREATININE 0.72 08/30/2015 1238   CALCIUM 9.3 08/30/2015 1238   GFRNONAA 164.57 09/17/2010 1416    Lipid Panel     Component Value Date/Time   CHOL 160 08/30/2015 1238  TRIG 62.0 08/30/2015 1238   HDL 35.40* 08/30/2015 1238   CHOLHDL 5 08/30/2015 1238   VLDL 12.4 08/30/2015 1238   LDLCALC 112* 08/30/2015 1238    CBC    Component Value Date/Time   WBC 7.4 08/30/2015 1238   RBC 4.49 08/30/2015 1238   HGB 12.5 08/30/2015 1238   HCT 37.8 08/30/2015 1238   PLT 354.0 08/30/2015 1238   MCV 84.1 08/30/2015 1238   MCHC 33.0 08/30/2015 1238   RDW 13.0 08/30/2015 1238   LYMPHSABS 1.3 08/30/2015 1238   MONOABS 0.9 08/30/2015 1238   EOSABS 0.1 08/30/2015 1238   BASOSABS 0.0 08/30/2015 1238    Hgb A1C No results found for: HGBA1C       Assessment & Plan:   Encounter  for form completion with patient:  Form filled out Come back tomorrow to have TB test read  RTC as needed or if symptoms persist or worsen

## 2015-12-22 LAB — TB SKIN TEST
Induration: 0 mm
TB SKIN TEST: NEGATIVE

## 2016-03-16 ENCOUNTER — Ambulatory Visit (INDEPENDENT_AMBULATORY_CARE_PROVIDER_SITE_OTHER): Payer: BLUE CROSS/BLUE SHIELD | Admitting: Family Medicine

## 2016-03-16 ENCOUNTER — Ambulatory Visit: Payer: BLUE CROSS/BLUE SHIELD | Admitting: Family Medicine

## 2016-03-16 ENCOUNTER — Encounter: Payer: Self-pay | Admitting: Family Medicine

## 2016-03-16 VITALS — BP 126/84 | HR 120 | Temp 101.9°F | Wt 246.0 lb

## 2016-03-16 DIAGNOSIS — J020 Streptococcal pharyngitis: Secondary | ICD-10-CM | POA: Diagnosis not present

## 2016-03-16 LAB — POCT RAPID STREP A (OFFICE): RAPID STREP A SCREEN: POSITIVE — AB

## 2016-03-16 LAB — POCT INFLUENZA A/B
Influenza A, POC: NEGATIVE
Influenza B, POC: NEGATIVE

## 2016-03-16 MED ORDER — AMOXICILLIN 500 MG PO TABS: 500.0000 mg | ORAL_TABLET | Freq: Two times a day (BID) | ORAL | Status: DC

## 2016-03-16 NOTE — Progress Notes (Signed)
Pre visit review using our clinic review tool, if applicable. No additional management support is needed unless otherwise documented below in the visit note. 

## 2016-03-16 NOTE — Assessment & Plan Note (Signed)
No acute problem. Rapid strep positive today. Flu negative. Treating with amoxicillin.

## 2016-03-16 NOTE — Patient Instructions (Signed)
Take the medication as prescribed.  Ibuprofen for pain.  Call if you worsen.  Take care  Dr. Adriana [place]ook   Strep Throat Strep throat is a bacterial infection of the throat. Your health care provider may call the infection tonsillitis or pharyngitis, depending on whether there is swelling in the tonsils or at the back of the throat. Strep throat is most common during the cold months of the year in children who are 635-23 years of age, but it can happen during any season in people of any age. This infection is spread from person to person (contagious) through coughing, sneezing, or close contact. CAUSES Strep throat is caused by the bacteria called Streptococcus pyogenes. RISK FACTORS This condition is more likely to develop in:  People who spend time in crowded places where the infection can spread easily.  People who have close contact with someone who has strep throat. SYMPTOMS Symptoms of this condition include:  Fever or chills.   Redness, swelling, or pain in the tonsils or throat.  Pain or difficulty when swallowing.  White or yellow spots on the tonsils or throat.  Swollen, tender glands in the neck or under the jaw.  Red rash all over the body (rare). DIAGNOSIS This condition is diagnosed by performing a rapid strep test or by taking a swab of your throat (throat culture test). Results from a rapid strep test are usually ready in a few minutes, but throat culture test results are available after one or two days. TREATMENT This condition is treated with antibiotic medicine. HOME CARE INSTRUCTIONS Medicines  Take over-the-counter and prescription medicines only as told by your health care provider.  Take your antibiotic as told by your health care provider. Do not stop taking the antibiotic even if you start to feel better.  Have family members who also have a sore throat or fever tested for strep throat. They may need antibiotics if they have the strep infection. Eating  and Drinking  Do not share food, drinking cups, or personal items that could cause the infection to spread to other people.  If swallowing is difficult, try eating soft foods until your sore throat feels better.  Drink enough fluid to keep your urine clear or pale yellow. General Instructions  Gargle with a salt-water mixture 3-4 times per day or as needed. To make a salt-water mixture, completely dissolve -1 tsp of salt in 1 cup of warm water.  Make sure that all household members wash their hands well.  Get plenty of rest.  Stay home from school or work until you have been taking antibiotics for 24 hours.  Keep all follow-up visits as told by your health care provider. This is important. SEEK MEDICAL CARE IF:  The glands in your neck continue to get bigger.  You develop a rash, cough, or earache.  You cough up a thick liquid that is green, yellow-brown, or bloody.  You have pain or discomfort that does not get better with medicine.  Your problems seem to be getting worse rather than better.  You have a fever. SEEK IMMEDIATE MEDICAL CARE IF:  You have new symptoms, such as vomiting, severe headache, stiff or painful neck, chest pain, or shortness of breath.  You have severe throat pain, drooling, or changes in your voice.  You have swelling of the neck, or the skin on the neck becomes red and tender.  You have signs of dehydration, such as fatigue, dry mouth, and decreased urination.  You become increasingly sleepy,  or you cannot wake up completely.  Your joints become red or painful.   This information is not intended to replace advice given to you by your health care provider. Make sure you discuss any questions you have with your health care provider.   Document Released: 10/11/2000 Document Revised: 07/05/2015 Document Reviewed: 02/06/2015 Elsevier Interactive Patient Education [employer]

## 2016-03-16 NOTE — Progress Notes (Signed)
Subjective:  Patient ID: Vanessa Merritt, female    DOB: 02/19/1991  Age: 25 y.o. MRN: [id number]  CC: Sore throat, body, fever, chills, ear pain  HPI:  25 year old female presents with the above complaints.  Patient states that she has not been feeling well since last night. She states that she suddenly developed severe sore throat, body aches, chills, fever, and bilateral ear pain. 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 She's taken over-the-counter ibuprofen and cold and sinus medication with no relief. No known exacerbating factors. She is concerned about underlying infection. No other complaint at this time.  Social Hx   Social History   Social History  . Marital Status: Single    Spouse Name: N/A  . Number of Children: N/A  . Years of Education: N/A   Social History Main Topics  . Smoking status: Never Smoker   . Smokeless tobacco: None  . Alcohol Use: No  . Drug Use: No  . Sexual Activity: Not Asked   Other Topics Concern  . None   Social History Narrative   Teacher   Review of Systems  Constitutional: Positive for fever, chills and fatigue.  HENT: Positive for ear pain and sore throat.   Musculoskeletal:       Body aches.   Objective:  BP 126/84 mmHg  Pulse 120  Temp(Src) 101.9 F (38.8 C) (Oral)  Wt 246 lb (111.585 kg)  SpO2 99%  LMP 03/06/2016  BP/Weight 03/16/2016 12/21/2015 08/30/2015  Systolic BP 126 128 122  Diastolic BP 84 80 70  Wt. (Lbs) 246 236 238.25  BMI 38.84 37.26 37.62    Physical Exam  Constitutional: She is oriented to person, place, and time.  Appears sick but in no apparent distress.  HENT:  Head: Normocephalic and atraumatic.  Oropharynx - moderate to severe erythema with exudate. Normal TMs bilaterally.  Eyes: Conjunctivae are normal.  Cardiovascular:  Tachycardic. Regular rhythm.  Pulmonary/Chest: Effort normal. She has no wheezes. She has no rales.  Lymphadenopathy:    She has cervical  adenopathy.  Neurological: She is alert and oriented to person, place, and time.  Vitals reviewed.   Lab Results  Component Value Date   WBC 7.4 08/30/2015   HGB 12.5 08/30/2015   HCT 37.8 08/30/2015   PLT 354.0 08/30/2015   GLUCOSE 78 08/30/2015   CHOL 160 08/30/2015   TRIG 62.0 08/30/2015   HDL 35.40* 08/30/2015   LDLCALC 112* 08/30/2015   ALT 13 08/30/2015   AST 15 08/30/2015   NA 136 08/30/2015   K 4.3 08/30/2015   CL 103 08/30/2015   CREATININE 0.72 08/30/2015   BUN 8 08/30/2015   CO2 27 08/30/2015   TSH 1.29 08/30/2015   Results for orders placed or performed in visit on 03/16/16 (from the past 24 hour(s))  Rapid Strep A     Status: Abnormal   Collection Time: 03/16/16  1:27 PM  Result Value Ref Range   Rapid Strep A Screen Positive (A) Negative  POCT Influenza A/B     Status: Normal   Collection Time: 03/16/16  1:28 PM  Result Value Ref Range   Influenza A, POC Negative Negative   Influenza B, POC Negative Negative    Assessment & Plan:   Problem List Items Addressed This Visit    Streptococcal pharyngitis - Primary    No acute problem. Rapid strep positive today. Flu negative. Treating with amoxicillin.      Relevant Medications  amoxicillin (AMOXIL) 500 MG tablet   Other Relevant Orders   Rapid Strep A (Completed)   POCT Influenza A/B (Completed)      Meds ordered this encounter  Medications  . amoxicillin (AMOXIL) 500 MG tablet    Sig: Take 1 tablet (500 mg total) by mouth 2 (two) times daily.    Dispense:  20 tablet    Refill:  0    Follow-up: PRN  Everlene Other DO [facility]

## 2016-03-18 ENCOUNTER — Telehealth: Payer: Self-pay

## 2016-03-18 NOTE — Telephone Encounter (Signed)
PLEASE NOTE: All timestamps contained within this report are represented as [place] Time. CONFIDENTIALTY NOTICE: This fax transmission is intended only for the addressee. It contains information that is legally privileged, confidential or otherwise protected from use or disclosure. If you are not the intended recipient, you are strictly prohibited from reviewing, disclosing, copying using or disseminating any of this information or taking any action in reliance on or regarding this information. If you have received this fax in error, please notify us immediately by telephone so that we can arrange for its return to us. Phone: [phone], Toll-Free: [phone], Fax: [phone] Page: 1 of 1 Call Id: [id number] [facility] - Client Nonclinical Telephone Record [facility] Day Contact Type Call Who Is Calling Patient / Member / Family / Caregiver Caller Name Vanessa Merritt Caller Phone Number [phone] Patient Name Vanessa Merritt Call Type Message Only Information Provided Reason for Call Request for General Office Information Initial Comment Caller states she was triaged and referred to Saturday morning clinic. The number that the RN gave her is going straight to voicemail. Caller hung up while I was on hold trying to reach [place] office via backline. Additional Comment Call Closed By: Burt EkAshleigh Brown Transaction Date/Time: 03/16/2016 9:06:47 AM (ET)

## 2016-03-18 NOTE — Telephone Encounter (Signed)
PLEASE NOTE: All timestamps contained within this report are represented as [place] Standard Time. CONFIDENTIALTY NOTICE: This fax transmission is intended only for the addressee. It contains information that is legally privileged, confidential or otherwise protected from use or disclosure. If you are not the intended recipient, you are strictly prohibited from reviewing, disclosing, copying using or disseminating any of this information or taking any action in reliance on or regarding this information. If you have received this fax in error, please notify us immediately by telephone so that we can arrange for its return to us. Phone: [phone], Toll-Free: [phone], Fax: [phone] Page: 1 of 2 Call Id: [id number] [facility] - Client TELEPHONE ADVICE RECORD [facility] Patient Name: Vanessa FrayCARA JOHNSON Gender: Female DOB: 04/01/1991 Age: [age] Y 9 M 15 D Return Phone Number: [phone] (Primary), [phone] (Secondary) Address: City/State/Zip: [place] Client [facility] Night Physician Ruthe MannanAron, Talia - MD Contact Type Call Who Is Calling Patient / Member / Family / Caregiver Call Type Triage / Clinical Caller Name Margaretmary EddySandy Johnson Relationship To Patient Mother Return Phone Number [phone] (Primary) Chief Complaint Fever (non urgent symptom) (> THREE MONTHS) Reason for Call Symptomatic / Request for Health Information Initial Comment Caller states her daughter has fever and sore throat PreDisposition Call Doctor Translation No Nurse Assessment Nurse: Annye Englisharmon, RN, Angelique Blonderenise Date/Time (Eastern Time): 03/16/2016 8:10:13 AM Confirm and document reason for call. If symptomatic, describe symptoms. You must click the next button to save text entered. ---Caller states her daughter has fever of 99.9, sore throat, right earache Has the patient traveled out of the  country within the last 30 days? ---Not Applicable Does the patient have any new or worsening symptoms? ---Yes Will a triage be completed? ---Yes Related visit to physician within the last 2 weeks? ---Yes Does the PT have any chronic conditions? (i.e. diabetes, asthma, etc.) ---No Is the patient pregnant or possibly pregnant? (Ask all females between the ages of 523-55) ---No Is this a behavioral health or substance abuse call? ---No Guidelines Guideline Title Affirmed Question Affirmed Notes Nurse Date/Time (Eastern Time) Sore Throat SEVERE (e.g., excruciating) throat pain Carmon, RN, Angelique Blonderenise 03/16/2016 8:11:24 AM Disp. Time Lamount Cohen(Eastern Time) Disposition Final User 03/16/2016 8:16:30 AM See Physician within 24 Hours Yes Carmon, RN, Angelique Blonderenise PLEASE NOTE: All timestamps contained within this report are represented as [place] Standard Time. CONFIDENTIALTY NOTICE: This fax transmission is intended only for the addressee. It contains information that is legally privileged, confidential or otherwise protected from use or disclosure. If you are not the intended recipient, you are strictly prohibited from reviewing, disclosing, copying using or disseminating any of this information or taking any action in reliance on or regarding this information. If you have received this fax in error, please notify us immediately by telephone so that we can arrange for its return to us. Phone: [phone], Toll-Free: [phone], Fax: [phone] Page: 2 of 2 Call Id: [id number] Caller Understands: Yes Disagree/Comply: Comply Care Advice Given Per Guideline SEE PHYSICIAN WITHIN 24 HOURS: SORE THROAT - For relief of sore throat: * Sip warm chicken broth or apple juice. * Suck on hard candy or a throat lozenge (OTC). * Gargle with warm salt water four times a day. To make salt water, put 1/2 teaspoon of salt in 8 oz (240 ml) of warm water. PAIN OR FEVER MEDICINES: IBUPROFEN (E.G., MOTRIN, ADVIL): * The most  you should take each  day is 1,200 mg (six 200 mg pills a day), unless your doctor has told you to take more. SOFT DIET: * Eat a soft diet. * Cold drinks, popsicles, and milk shakes are especially good. Avoid citrus fruits. DRINK PLENTY LIQUIDS: * Drink plenty of liquids. This is important to prevent dehydation. CALL BACK IF: * You become worse. CARE ADVICE given per Sore Throat (Adult) guideline. Referrals [facility]

## 2016-03-18 NOTE — Telephone Encounter (Signed)
PLEASE NOTE: All timestamps contained within this report are represented as [place]. CONFIDENTIALTY NOTICE: This fax transmission is intended only for the addressee. It contains information that is legally privileged, confidential or otherwise protected from use or disclosure. If you are not the intended recipient, you are strictly prohibited from reviewing, disclosing, copying using or disseminating any of this information or taking any action in reliance on or regarding this information. If you have received this fax in error, please notify us immediately by telephone so that we can arrange for its return to us. Phone: [phone], Toll-Free: [phone], Fax: [phone] Page: 1 of 2 Call Id: [id number] [facility] Client TELEPHONE ADVICE RECORD [facility] Patient Name: Vanessa FrayCARA JOHNSON Gender: Female DOB: 04/01/1991 Age: [age] Y 9 M 15 D Return Phone Number: [phone] (Primary), [phone] (Secondary) Address: City/State/Zip: [place] Client [facility] Physician Ruthe MannanAron, Talia - MD Contact Type Call Who Is Calling Patient / Member / Family / Caregiver Call Type Triage / Clinical Caller Name Margaretmary EddySandy Johnson Relationship To Patient Mother Return Phone Number [phone] (Primary) Chief Complaint Fever (non urgent symptom) (> THREE MONTHS) Reason for Call Symptomatic / Request for Health Information Initial Comment Caller states her daughter has fever and sore throat PreDisposition Call Doctor Translation No Nurse Assessment Nurse: Annye Englisharmon, RN, Angelique Blonderenise Date/Time (Eastern Time): 03/16/2016 8:10:13 AM Confirm and document reason for call. If symptomatic, describe symptoms. You must click the next button to save text entered. ---Caller states her daughter has fever of 99.9, sore throat, right earache Has the patient traveled out of the  country within the last 30 days? ---Not Applicable Does the patient have any new or worsening symptoms? ---Yes Will a triage be completed? ---Yes Related visit to physician within the last 2 weeks? ---Yes Does the PT have any chronic conditions? (i.e. diabetes, asthma, etc.) ---No Is the patient pregnant or possibly pregnant? (Ask all females between the ages of 5312-55) ---No Is this a behavioral health or substance abuse call? ---No Guidelines Guideline Title Affirmed Question Affirmed Notes Nurse Date/Time (Eastern Time) Sore Throat SEVERE (e.g., excruciating) throat pain Carmon, RN, Angelique Blonderenise 03/16/2016 8:11:24 AM Disp. Time Lamount Cohen(Eastern Time) Disposition Final User 03/16/2016 8:16:30 AM See Physician within 24 Hours Yes Carmon, RN, Angelique Blonderenise PLEASE NOTE: All timestamps contained within this report are represented as [place] Standard Time. CONFIDENTIALTY NOTICE: This fax transmission is intended only for the addressee. It contains information that is legally privileged, confidential or otherwise protected from use or disclosure. If you are not the intended recipient, you are strictly prohibited from reviewing, disclosing, copying using or disseminating any of this information or taking any action in reliance on or regarding this information. If you have received this fax in error, please notify us immediately by telephone so that we can arrange for its return to us. Phone: [phone], Toll-Free: [phone], Fax: [phone] Page: 2 of 2 Call Id: [id number] Caller Understands: Yes Disagree/Comply: Comply Care Advice Given Per Guideline SEE PHYSICIAN WITHIN 24 HOURS: SORE THROAT - For relief of sore throat: * Sip warm chicken broth or apple juice. * Suck on hard candy or a throat lozenge (OTC). * Gargle with warm salt water four times a day. To make salt water, put 1/2 teaspoon of salt in 8 oz (240 ml) of warm water. PAIN OR FEVER MEDICINES: IBUPROFEN (E.G., MOTRIN, ADVIL): * The most  you should take each  day is 1,200 mg (six 200 mg pills a day), unless your doctor has told you to take more. SOFT DIET: * Eat a soft diet. * Cold drinks, popsicles, and milk shakes are especially good. Avoid citrus fruits. DRINK PLENTY LIQUIDS: * Drink plenty of liquids. This is important to prevent dehydation. CALL BACK IF: * You become worse. CARE ADVICE given per Sore Throat (Adult) guideline. Referrals [facility] 

## 2016-03-18 NOTE — Telephone Encounter (Signed)
Pt seen [facility] Sat clinic 03/16/16.

## 2016-06-19 ENCOUNTER — Ambulatory Visit (INDEPENDENT_AMBULATORY_CARE_PROVIDER_SITE_OTHER): Payer: BLUE CROSS/BLUE SHIELD | Admitting: Family Medicine

## 2016-06-19 ENCOUNTER — Encounter: Payer: Self-pay | Admitting: Family Medicine

## 2016-06-19 DIAGNOSIS — J029 Acute pharyngitis, unspecified: Secondary | ICD-10-CM | POA: Diagnosis not present

## 2016-06-19 DIAGNOSIS — R300 Dysuria: Secondary | ICD-10-CM | POA: Insufficient documentation

## 2016-06-19 MED ORDER — CEPHALEXIN 500 MG PO CAPS: 500.0000 mg | ORAL_CAPSULE | Freq: Two times a day (BID) | ORAL | 0 refills | Status: DC

## 2016-06-19 NOTE — Progress Notes (Signed)
   Subjective:   Patient ID: Vanessa Merritt, female    DOB: 06/17/1991, 25 y.o.   MRN: [id number]  Vanessa Merritt is a pleasant 25 y.o. year old female who presents to clinic today with Sore Throat  on 06/19/2016  HPI:  Sore throat- acute onset last night of severe sore throat.  Now nauseated.  No fever but has had chills. Ibuprofen has helped some with pain.  No difficulty swallowing but does have pain with swallowing.  Dysuria- two days of dysuria.  No fevers, back pain, nausea or vomiting.  No current outpatient prescriptions on file prior to visit.   No current facility-administered medications on file prior to visit.     No Known Allergies  Past Medical History:  Diagnosis Date  . Back pain     No past surgical history on file.  Family History  Problem Relation Age of Onset  . Hypothyroidism Mother   . Hyperlipidemia Mother   . Hypertension Father     Social History   Social History  . Marital status: Single    Spouse name: N/A  . Number of children: N/A  . Years of education: N/A   Occupational History  . Not on file.   Social History Main Topics  . Smoking status: Never Smoker  . Smokeless tobacco: Not on file  . Alcohol use No  . Drug use: No  . Sexual activity: Not on file   Other Topics Concern  . Not on file   Social History Narrative   Teacher   The PMH, PSH, Social History, Family History, Medications, and allergies [facility], and have been updated if relevant.   Review of Systems  Constitutional: Positive for fatigue. Negative for fever.  HENT: Positive for sore throat. Negative for congestion, dental problem, drooling, ear discharge, ear pain, facial swelling, hearing loss, mouth sores, nosebleeds, postnasal drip, rhinorrhea, sinus pressure, sneezing, tinnitus, trouble swallowing and voice change.   Eyes: Negative.   Respiratory: Negative.   Gastrointestinal: Positive for nausea. Negative for vomiting.  Genitourinary:  Positive for dysuria and frequency. Negative for flank pain.  Musculoskeletal: Negative.   Neurological: Negative.   All other systems reviewed and are negative.      Objective:    BP 118/70   Pulse 90   Temp 98 F (36.7 C) (Oral)   Wt 247 lb (112 kg)   LMP 06/03/2016 (Approximate)   SpO2 97%   BMI 38.98 kg/m    Physical Exam  Constitutional: She is oriented to person, place, and time. She appears well-developed and well-nourished. No distress.  HENT:  Head: Normocephalic.  Right Ear: Hearing and tympanic membrane normal.  Left Ear: Hearing and tympanic membrane normal.  Mouth/Throat: Oropharyngeal exudate present.  Eyes: Conjunctivae are normal.  Cardiovascular: Normal rate.   Pulmonary/Chest: Effort normal.  Abdominal: Soft. She exhibits no distension. There is no tenderness. There is no CVA tenderness.  Musculoskeletal: Normal range of motion.  Neurological: She is alert and oriented to person, place, and time. No cranial nerve deficit.  Skin: Skin is warm and dry. She is not diaphoretic.  Psychiatric: She has a normal mood and affect. Her behavior is normal. Judgment and thought content normal.  Nursing note and vitals reviewed.         Assessment & Plan:   Dysuria  Sore throat No Follow-up on file.

## 2016-06-19 NOTE — Progress Notes (Signed)
Pre visit review using our clinic review tool, if applicable. No additional management support is needed unless otherwise documented below in the visit note. 

## 2016-06-19 NOTE — Patient Instructions (Signed)
Great to see you. Please take Keflex as directed- 1 tablet twice daily x 10 days.  We will call you with your urine culture from today.  Ok to continue taking Ibuprofen as needed for pain/swelling.

## 2016-06-19 NOTE — Assessment & Plan Note (Signed)
Rapid strep neg but exam and history consistent with strep pharyngitis. Treat with Keflex 500 mg twice daily x 10 days to cover possible UTI. Send urine for cx. The patient indicates understanding of these issues and agrees with the plan.

## 2016-06-19 NOTE — Addendum Note (Signed)
Addended by: Desmond DikeKNIGHT, Brendi Mccarroll H on: 06/19/2016 12:20 PM   Modules accepted: Orders

## 2016-06-19 NOTE — Assessment & Plan Note (Signed)
New- Keflex 500 mg twice daily x 10 days. Send urine for cx.

## 2016-06-21 LAB — URINE CULTURE: Organism ID, Bacteria: 10000

## 2017-07-08 ENCOUNTER — Ambulatory Visit (INDEPENDENT_AMBULATORY_CARE_PROVIDER_SITE_OTHER): Payer: BC Managed Care – PPO | Admitting: Internal Medicine

## 2017-07-08 ENCOUNTER — Encounter: Payer: Self-pay | Admitting: Internal Medicine

## 2017-07-08 VITALS — BP 120/84 | HR 77 | Temp 98.1°F | Wt 262.0 lb

## 2017-07-08 DIAGNOSIS — J208 Acute bronchitis due to other specified organisms: Secondary | ICD-10-CM | POA: Diagnosis not present

## 2017-07-08 DIAGNOSIS — B351 Tinea unguium: Secondary | ICD-10-CM | POA: Diagnosis not present

## 2017-07-08 MED ORDER — AZITHROMYCIN 250 MG PO TABS: ORAL_TABLET | ORAL | 0 refills | Status: DC

## 2017-07-08 MED ORDER — HYDROCODONE-HOMATROPINE 5-1.5 MG/5ML PO SYRP: 5.0000 mL | ORAL_SOLUTION | Freq: Three times a day (TID) | ORAL | 0 refills | Status: DC | PRN

## 2017-07-08 NOTE — Patient Instructions (Signed)

## 2017-07-08 NOTE — Progress Notes (Signed)
Subjective:    Patient ID: Vanessa Merritt, female    DOB: 04/12/1991, 26 y.o.   MRN: [id number]  HPI  Pt presents to the clinic today with c/o nasal congestion and cough. This started a few months ago, but got worse over the last 4 days. She is not able to blow anything out of her nose. The cough is productive of clear/white mucous. She denies runny nose, ear pain, sore throat or shortness of breath. She has tried Mucinex, Sudafed, Saline nasal spray, Zyrtec and cough syrup with minimal relief. She has not had sick contacts that she is aware of.   She also reports fungal infection of nail of right middle finger. She has not put anything on it. She reports the nail is starting to lift up and is painful.  Review of Systems      Past Medical History:  Diagnosis Date  . Back pain     Current Outpatient Prescriptions  Medication Sig Dispense Refill  . azithromycin (ZITHROMAX) 250 MG tablet Take 2 tabs today, then 1 tab daily x 4 days 6 tablet 0  . HYDROcodone-homatropine (HYCODAN) 5-1.5 MG/5ML syrup Take 5 mLs by mouth every 8 (eight) hours as needed for cough. 120 mL 0   No current facility-administered medications for this visit.     No Known Allergies  Family History  Problem Relation Age of Onset  . Hypothyroidism Mother   . Hyperlipidemia Mother   . Hypertension Father     Social History   Social History  . Marital status: Single    Spouse name: N/A  . Number of children: N/A  . Years of education: N/A   Occupational History  . Not on file.   Social History Main Topics  . Smoking status: Never Smoker  . Smokeless tobacco: Never Used  . Alcohol use No  . Drug use: No  . Sexual activity: Not on file   Other Topics Concern  . 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  HEENT: Pt reports nasal congestion. Denies eye pain, eye redness, ear pain, ringing in the ears,  wax buildup, runny nose, bloody nose, or sore throat. Respiratory: Pt reports cough. Denies difficulty breathing, shortness of breath.   Skin: Pt reports fungal infection of nail.  No other specific complaints in a complete review of systems (except as listed in HPI above).  Objective:   Physical Exam  BP 120/84   Pulse 77   Temp 98.1 F (36.7 C) (Oral)   Wt 262 lb (118.8 kg)   LMP 07/07/2017   SpO2 98%   BMI 41.34 kg/m  Wt Readings from Last 3 Encounters:  07/08/17 262 lb (118.8 kg)  06/19/16 247 lb (112 kg)  03/16/16 246 lb (111.6 kg)    General: Appears her stated age, in NAD. Skin: Thick, discolored fungal infection of nail on right middle finger. HEENT: Head: normal shape and size, no sinus tenderness noted;  Throat/Mouth: Teeth present, mucosa pink and moist, no exudate, lesions or ulcerations noted.  Neck:  No adenopathy noted. Pulmonary/Chest: Normal effort and scattered rhonchi throughout. No respiratory distress. No wheezes, rales or noted.   BMET    Component Value Date/Time   NA 136 08/30/2015 1238   K 4.3 08/30/2015 1238   CL 103 08/30/2015 1238   CO2 27 08/30/2015 1238   GLUCOSE 78 08/30/2015 1238   BUN 8  08/30/2015 1238   CREATININE 0.72 08/30/2015 1238   CALCIUM 9.3 08/30/2015 1238   GFRNONAA 164.57 09/17/2010 1416    Lipid Panel     Component Value Date/Time   CHOL 160 08/30/2015 1238   TRIG 62.0 08/30/2015 1238   HDL 35.40 (L) 08/30/2015 1238   CHOLHDL 5 08/30/2015 1238   VLDL 12.4 08/30/2015 1238   LDLCALC 112 (H) 08/30/2015 1238    CBC    Component Value Date/Time   WBC 7.4 08/30/2015 1238   RBC 4.49 08/30/2015 1238   HGB 12.5 08/30/2015 1238   HCT 37.8 08/30/2015 1238   PLT 354.0 08/30/2015 1238   MCV 84.1 08/30/2015 1238   MCHC 33.0 08/30/2015 1238   RDW 13.0 08/30/2015 1238   LYMPHSABS 1.3 08/30/2015 1238   MONOABS 0.9 08/30/2015 1238   EOSABS 0.1 08/30/2015 1238   BASOSABS 0.0 08/30/2015 1238    Hgb A1C No results found  for: HGBA1C          Assessment & Plan:   Acute Bronchitis:  Viral Advised her continue Mucinex Start Flonase OTC eRx for Azithromax 250 mg BID to start over the weekend if you start having fever, colored mucous RX for Hycodan for cough  Fungal Infection of Nail:  CMET today If liver enzymes normal, will treat with Terbinafine 250 mg x 12 weeks  Return precautions discussed Nicki ReaperBAITY, Huie Ghuman, NP

## 2017-07-09 ENCOUNTER — Other Ambulatory Visit: Payer: Self-pay | Admitting: Internal Medicine

## 2017-07-09 DIAGNOSIS — B351 Tinea unguium: Secondary | ICD-10-CM

## 2017-07-09 LAB — COMPREHENSIVE METABOLIC PANEL
ALBUMIN: 4.2 g/dL (ref 3.5–5.2)
ALK PHOS: 82 U/L (ref 39–117)
ALT: 14 U/L (ref 0–35)
AST: 15 U/L (ref 0–37)
BILIRUBIN TOTAL: 0.3 mg/dL (ref 0.2–1.2)
BUN: 11 mg/dL (ref 6–23)
CALCIUM: 9.7 mg/dL (ref 8.4–10.5)
CHLORIDE: 103 meq/L (ref 96–112)
CO2: 27 mEq/L (ref 19–32)
CREATININE: 0.7 mg/dL (ref 0.40–1.20)
GFR: 109.17 mL/min (ref >60.00)
Glucose, Bld: 73 mg/dL (ref 70–99)
Potassium: 4.4 mEq/L (ref 3.5–5.1)
Sodium: 138 mEq/L (ref 135–145)
TOTAL PROTEIN: 7.5 g/dL (ref 6.0–8.3)

## 2017-07-09 MED ORDER — TERBINAFINE HCL 250 MG PO TABS: 250.0000 mg | ORAL_TABLET | Freq: Every day | ORAL | 0 refills | Status: DC

## 2017-07-16 ENCOUNTER — Ambulatory Visit (INDEPENDENT_AMBULATORY_CARE_PROVIDER_SITE_OTHER): Payer: BC Managed Care – PPO | Admitting: Internal Medicine

## 2017-07-16 ENCOUNTER — Encounter: Payer: Self-pay | Admitting: Internal Medicine

## 2017-07-16 VITALS — BP 120/78 | HR 83 | Temp 98.2°F | Wt 261.8 lb

## 2017-07-16 DIAGNOSIS — R0982 Postnasal drip: Secondary | ICD-10-CM

## 2017-07-16 DIAGNOSIS — Z9109 Other allergy status, other than to drugs and biological substances: Secondary | ICD-10-CM

## 2017-07-16 MED ORDER — METHYLPREDNISOLONE ACETATE 80 MG/ML IJ SUSP
80.0000 mg | Freq: Once | INTRAMUSCULAR | Status: AC
  Administered 2017-07-16: 80 mg via INTRAMUSCULAR

## 2017-07-16 NOTE — Progress Notes (Signed)
Subjective:    Patient ID: Vanessa Merritt, female    DOB: 1990/11/25, 26 y.o.   MRN: [id number]  HPI  Pt presents to the clinic today to follow up bronchitis. She was seen 1 week ago for the same, treated with Azithromax and Hycodan. She reports persistent sneezing, runny nose and cough. The cough is nonproductive. She denies shortness of breath. She denies fever, chills or body aches. She has been taking Mucinex, Benadryl and Flonase with minimal relief.  Review of Systems      Past Medical History:  Diagnosis Date  . Back pain     Current Outpatient Prescriptions  Medication Sig Dispense Refill  . HYDROcodone-homatropine (HYCODAN) 5-1.5 MG/5ML syrup Take 5 mLs by mouth every 8 (eight) hours as needed for cough. 120 mL 0  . terbinafine (LAMISIL) 250 MG tablet Take 1 tablet (250 mg total) by mouth daily. 30 tablet 0   No current facility-administered medications for this visit.     No Known Allergies  Family History  Problem Relation Age of Onset  . Hypothyroidism Mother   . Hyperlipidemia Mother   . Hypertension Father     Social History   Social History  . Marital status: Single    Spouse name: N/A  . Number of children: N/A  . Years of education: N/A   Occupational History  . Not on file.   Social History Main Topics  . Smoking status: Never Smoker  . Smokeless tobacco: Never Used  . Alcohol use No  . Drug use: No  . Sexual activity: Not on file   Other Topics Concern  . 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  HEENT: Pt reports runny nose. Denies eye pain, eye redness, ear pain, ringing in the ears, wax buildup, nasal congestion, bloody nose, or sore throat. Respiratory: Pt reports cough. Denies difficulty breathing, shortness of breath, or sputum production.   .  No other specific complaints in a complete review of systems (except as listed in HPI  above).  Objective:   Physical Exam  Wt 261 lb 12 oz (118.7 kg)   LMP 07/07/2017   BMI 41.30 kg/m  Wt Readings from Last 3 Encounters:  07/16/17 261 lb 12 oz (118.7 kg)  07/08/17 262 lb (118.8 kg)  06/19/16 247 lb (112 kg)    General: Appears her stated age, well developed, well nourished in NAD. HEENT:  Nose: mucosa pink and moist, septum midline; Throat/Mouth: Teeth present, mucosa erythematous and moist, + PND, no exudate, lesions or ulcerations noted.  Neck:  No adenopathy noted. Pulmonary/Chest: Normal effort and positive vesicular breath sounds. No respiratory distress. No wheezes, rales or ronchi noted.   BMET    Component Value Date/Time   NA 138 07/08/2017 1603   K 4.4 07/08/2017 1603   CL 103 07/08/2017 1603   CO2 27 07/08/2017 1603   GLUCOSE 73 07/08/2017 1603   BUN 11 07/08/2017 1603   CREATININE 0.70 07/08/2017 1603   CALCIUM 9.7 07/08/2017 1603   GFRNONAA 164.57 09/17/2010 1416    Lipid Panel     Component Value Date/Time   CHOL 160 08/30/2015 1238   TRIG 62.0 08/30/2015 1238   HDL 35.40 (L) 08/30/2015 1238   CHOLHDL 5 08/30/2015 1238   VLDL 12.4 08/30/2015 1238   LDLCALC 112 (H) 08/30/2015 1238    CBC    Component Value Date/Time  WBC 7.4 08/30/2015 1238   RBC 4.49 08/30/2015 1238   HGB 12.5 08/30/2015 1238   HCT 37.8 08/30/2015 1238   PLT 354.0 08/30/2015 1238   MCV 84.1 08/30/2015 1238   MCHC 33.0 08/30/2015 1238   RDW 13.0 08/30/2015 1238   LYMPHSABS 1.3 08/30/2015 1238   MONOABS 0.9 08/30/2015 1238   EOSABS 0.1 08/30/2015 1238   BASOSABS 0.0 08/30/2015 1238    Hgb A1C No results found for: HGBA1C          Assessment & Plan:   Cough secondary to PND, Environmental Allergies:  Advised her to continue Zyrtec and Flonase No need for addiitional abx 80 mg Depo IM today Amb ref to allergy per pt request  Return precautions discussed Nicki Reaper, NP

## 2017-07-16 NOTE — Addendum Note (Signed)
Addended by: Roena Malady on: 07/16/2017 04:57 PM   Modules accepted: Orders

## 2017-07-16 NOTE — Patient Instructions (Signed)
Allergic Rhinitis Allergic rhinitis is when the mucous membranes in the nose respond to allergens. Allergens are particles in the air that cause your body to have an allergic reaction. This causes you to release allergic antibodies. Through a chain of events, these eventually cause you to release histamine into the blood stream. Although meant to protect the body, it is this release of histamine that causes your discomfort, such as frequent sneezing, congestion, and an itchy, runny nose. What are the causes? Seasonal allergic rhinitis (hay fever) is caused by pollen allergens that may come from grasses, trees, and weeds. Year-round allergic rhinitis (perennial allergic rhinitis) is caused by allergens such as house dust mites, pet dander, and mold spores. What are the signs or symptoms?  Nasal stuffiness (congestion).  Itchy, runny nose with sneezing and tearing of the eyes. How is this diagnosed? Your health care provider can help you determine the allergen or allergens that trigger your symptoms. If you and your health care provider are unable to determine the allergen, skin or blood testing may be used. Your health care provider will diagnose your condition after taking your health history and performing a physical exam. Your health care provider may assess you for other related conditions, such as asthma, pink eye, or an ear infection. How is this treated? Allergic rhinitis does not have a cure, but it can be controlled by:  Medicines that block allergy symptoms. These may include allergy shots, nasal sprays, and oral antihistamines.  Avoiding the allergen. Hay fever may often be treated with antihistamines in pill or nasal spray forms. Antihistamines block the effects of histamine. There are over-the-counter medicines that may help with nasal congestion and swelling around the eyes. Check with your health care provider before taking or giving this medicine. If avoiding the allergen or the  medicine prescribed do not work, there are many new medicines your health care provider can prescribe. Stronger medicine may be used if initial measures are ineffective. Desensitizing injections can be used if medicine and avoidance does not work. Desensitization is when a patient is given ongoing shots until the body becomes less sensitive to the allergen. Make sure you follow up with your health care provider if problems continue. Follow these instructions at home: It is not possible to completely avoid allergens, but you can reduce your symptoms by taking steps to limit your exposure to them. It helps to know exactly what you are allergic to so that you can avoid your specific triggers. Contact a health care provider if:  You have a fever.  You develop a cough that does not stop easily (persistent).  You have shortness of breath.  You start wheezing.  Symptoms interfere with normal daily activities. This information is not intended to replace advice given to you by your health care provider. Make sure you discuss any questions you have with your health care provider. Document Released: 07/09/2001 Document Revised: 06/14/2016 Document Reviewed: 06/21/2013 Elsevier Interactive Patient Education  2017 [employer]  

## 2017-08-11 ENCOUNTER — Other Ambulatory Visit: Payer: BC Managed Care – PPO

## 2017-08-13 ENCOUNTER — Other Ambulatory Visit (INDEPENDENT_AMBULATORY_CARE_PROVIDER_SITE_OTHER): Payer: BC Managed Care – PPO

## 2017-08-13 DIAGNOSIS — B351 Tinea unguium: Secondary | ICD-10-CM | POA: Diagnosis not present

## 2017-08-14 LAB — COMPREHENSIVE METABOLIC PANEL
ALBUMIN: 4 g/dL (ref 3.5–5.2)
ALK PHOS: 81 U/L (ref 39–117)
ALT: 15 U/L (ref 0–35)
AST: 14 U/L (ref 0–37)
BILIRUBIN TOTAL: 0.3 mg/dL (ref 0.2–1.2)
BUN: 14 mg/dL (ref 6–23)
CO2: 30 mEq/L (ref 19–32)
CREATININE: 0.68 mg/dL (ref 0.40–1.20)
Calcium: 9.3 mg/dL (ref 8.4–10.5)
Chloride: 102 mEq/L (ref 96–112)
GFR: 112.79 mL/min (ref >60.00)
GLUCOSE: 72 mg/dL (ref 70–99)
Potassium: 4.5 mEq/L (ref 3.5–5.1)
SODIUM: 138 meq/L (ref 135–145)
TOTAL PROTEIN: 7.4 g/dL (ref 6.0–8.3)

## 2017-08-15 MED ORDER — TERBINAFINE HCL 250 MG PO TABS: 250.0000 mg | ORAL_TABLET | Freq: Every day | ORAL | 1 refills | Status: DC

## 2017-08-15 NOTE — Addendum Note (Signed)
Addended by: Roena MaladyEVONTENNO, Salah Burlison Y on: 08/15/2017 12:54 PM   Modules accepted: Orders

## 2017-10-29 ENCOUNTER — Ambulatory Visit (INDEPENDENT_AMBULATORY_CARE_PROVIDER_SITE_OTHER): Payer: BC Managed Care – PPO | Admitting: Allergy and Immunology

## 2017-10-29 ENCOUNTER — Encounter: Payer: Self-pay | Admitting: Allergy and Immunology

## 2017-10-29 VITALS — BP 120/90 | HR 100 | Temp 98.2°F | Resp 16 | Ht 67.0 in | Wt 267.2 lb

## 2017-10-29 DIAGNOSIS — J320 Chronic maxillary sinusitis: Secondary | ICD-10-CM

## 2017-10-29 DIAGNOSIS — J3089 Other allergic rhinitis: Secondary | ICD-10-CM

## 2017-10-29 DIAGNOSIS — J4520 Mild intermittent asthma, uncomplicated: Secondary | ICD-10-CM | POA: Diagnosis not present

## 2017-10-29 DIAGNOSIS — R05 Cough: Secondary | ICD-10-CM

## 2017-10-29 DIAGNOSIS — R053 Chronic cough: Secondary | ICD-10-CM

## 2017-10-29 DIAGNOSIS — J329 Chronic sinusitis, unspecified: Secondary | ICD-10-CM | POA: Insufficient documentation

## 2017-10-29 MED ORDER — CARBINOXAMINE MALEATE 6 MG PO TABS: 6.0000 mg | ORAL_TABLET | Freq: Three times a day (TID) | ORAL | 5 refills | Status: DC | PRN

## 2017-10-29 MED ORDER — FLUTICASONE PROPIONATE 93 MCG/ACT NA EXHU: 2.0000 | INHALANT_SUSPENSION | Freq: Two times a day (BID) | NASAL | 5 refills | Status: DC

## 2017-10-29 MED ORDER — ALBUTEROL SULFATE 108 (90 BASE) MCG/ACT IN AEPB: 2.0000 | INHALATION_SPRAY | RESPIRATORY_TRACT | 1 refills | Status: DC | PRN

## 2017-10-29 MED ORDER — MONTELUKAST SODIUM 10 MG PO TABS: 10.0000 mg | ORAL_TABLET | Freq: Every day | ORAL | 5 refills | Status: DC

## 2017-10-29 NOTE — Assessment & Plan Note (Signed)
   Montelukast has been prescribed (as above).  A prescription has been provided for ProAir Respiclick, 1-2 inhalations every 4-6 hours as needed.  Subjective and objective measures of pulmonary function will be followed and the treatment plan will be adjusted accordingly.

## 2017-10-29 NOTE — Progress Notes (Signed)
New Patient Note  RE: Vanessa Merritt MRN: [id number] DOB: 01/11/1991 Date of Office Visit: 10/29/2017  Referring provider: Lorre MunroeBaity, Regina W, NP Primary care provider: Dianne DunAron, Talia M, MD  Chief Complaint: Sinus Problem; Nasal Congestion; and Cough   History of present illness: Vanessa Merritt is a 27 y.o. female seen today in consultation requested by Nicki Reaperegina Baity, NP.  Over the past 3 years she has been experiencing frequent nasal congestion, thick postnasal drainage, throat irritation, sneezing fits, and maxillary sinus pressure, and inner ear pressure.  No significant seasonal symptom variation has been noted nor have specific environmental triggers been identified.  She attempts to control the symptoms with fluticasone nasal spray and guaifenesin without adequate symptom relief.  In addition, she complains that her nasal symptoms often "settle in the chest with bad chest congestion."  She experiences frequent coughing, which seems to originate in the chest, as well as occasional chest tightness, dyspnea, and wheezing.  The cough seems to be worse at nighttime and lingers after all other symptoms from an upper respiratory tract infection have resolved.  She has a history of childhood asthma, however has not carried an albuterol rescue inhaler for many years.   Assessment and plan: Allergic rhinitis  Aeroallergen avoidance measures have been discussed and provided in written form.  A prescription has been provided for RyVent (carbinoxamine maleate) 6mg  every 6-8 hours as needed.  A prescription has been provided for montelukast 10 mg daily at bedtime.  [facility] 2 actuations per nostril twice a day. Proper technique has been discussed and demonstrated.  Nasal saline lavage (NeilMed) has been recommended as needed and prior to medicated nasal sprays along with instructions for proper administration.  If allergen avoidance measures and medications fail to  adequately relieve symptoms, aeroallergen immunotherapy will be considered.  Mild intermittent asthma  Montelukast has been prescribed (as above).  A prescription has been provided for ProAir Respiclick, 1-2 inhalations every 4-6 hours as needed.  Subjective and objective measures of pulmonary function will be followed and the treatment plan will be adjusted accordingly.  Cough, persistent The most common causes of chronic cough include the following: upper airway cough syndrome (UACS) which is caused by variety of rhinosinus conditions; asthma; gastroesophageal reflux disease (GERD); chronic bronchitis from cigarette smoking or other inhaled environmental irritants; non-asthmatic eosinophilic bronchitis; and bronchiectasis. In prospective studies, these conditions have accounted for up to 94% of the causes of chronic cough in immunocompetent adults. The history and physical examination suggest that her cough is multifactorial with contribution from his nasal drainage and bronchial hyperresponsiveness. We will address these issues at this time.   A prescription has been provided for a flutter valve to be used as needed to break the coughing cycle.  Treatment plan as outlined above.    We will regroup in 2 months to assess treatment response and adjust therapy accordingly.   Meds ordered this encounter  Medications  . Carbinoxamine Maleate (RYVENT) 6 MG TABS    Sig: Take 6 mg by mouth every 8 (eight) hours as needed.    Dispense:  30 tablet    Refill:  5    BIN [id number] RX PCN [id number] Group [id number] Cardholder ID  [id number] cash only  . montelukast (SINGULAIR) 10 MG tablet    Sig: Take 1 tablet (10 mg total) by mouth at bedtime.    Dispense:  30 tablet    Refill:  5  . Fluticasone Propionate (XHANCE) 93  MCG/ACT EXHU    Sig: Place 2 puffs into the nose 2 (two) times daily.    Dispense:  16 mL    Refill:  5  . Albuterol Sulfate (PROAIR RESPICLICK) 108 (90 Base) MCG/ACT AEPB    Sig:  Inhale 2 puffs into the lungs every 4 (four) hours as needed.    Dispense:  1 each    Refill:  1    Diagnostics: Spirometry: FVC was 4.07 L and FEV1 was 3.57 L 90 mL with bronchodilator improvement.  Premature asymptomatic Epicutaneous testing: Positive to rabbit and dust mite antigen. Intradermal testing: Positive to cat hair.   Physical examination: Blood pressure 120/90, pulse 100, temperature 98.2 F (36.8 C), temperature source Oral, resp. rate 16, height 5\' 7"  (1.702 m), weight 267 lb 3.2 oz (121.2 kg), SpO2 97 %.  General: Alert, interactive, in no acute distress. HEENT: TMs pearly gray, turbinates edematous with thick discharge, post-pharynx moderately erythematous. Neck: Supple without lymphadenopathy. Lungs: Clear to auscultation without wheezing, rhonchi or rales. CV: Normal S1, S2 without murmurs. Abdomen: Nondistended, nontender. Skin: Warm and dry, without lesions or rashes. Extremities:  No clubbing, cyanosis or edema. Neuro:   Grossly intact.  Review of systems:  Review of systems negative except as noted in HPI / PMHx or noted below: Review of Systems  Constitutional: Negative.   HENT: Negative.   Eyes: Negative.   Respiratory: Negative.   Cardiovascular: Negative.   Gastrointestinal: Negative.   Genitourinary: Negative.   Musculoskeletal: Negative.   Skin: Negative.   Neurological: Negative.   Endo/Heme/Allergies: Negative.   Psychiatric/Behavioral: Negative.     Past medical history:  Past Medical History:  Diagnosis Date  . Back pain   . Childhood asthma     Past surgical history:  History reviewed. No pertinent surgical history.  Family history: Family History  Problem Relation Age of Onset  . Hypothyroidism Mother   . Hyperlipidemia Mother   . Asthma Mother   . Allergic rhinitis Mother   . Hypertension Father   . Angioedema Neg Hx   . Eczema Neg Hx   . Immunodeficiency Neg Hx   . Urticaria Neg Hx     Social history: Social  History   Socioeconomic History  . Marital status: Single    Spouse name: Not on file  . Number of children: Not on file  . Years of education: Not on file  . Highest education level: Not on file  Social Needs  . Financial resource strain: Not on file  . Food insecurity - worry: Not on file  . Food insecurity - inability: Not on file  . Transportation needs - medical: Not on file  . Transportation needs - non-medical: Not on file  Occupational History  . Not on file  Tobacco Use  . Smoking status: Never Smoker  . Smokeless tobacco: Never Used  Substance and Sexual Activity  . Alcohol use: Yes  . Drug use: No  . Sexual activity: Not on file  Other Topics Concern  . Not on file  Social History Narrative   Teacher   Environmental History: The patient lives in an apartment with hardwood floors throughout and central air/heat.  There are 2 rabbits in the apartment which do not have access to her bedroom.  There is no known mold/water damage in the apartment.  She is a non-smoker.  Allergies as of 10/29/2017   No Known Allergies     Medication List        Accurate  as of 10/29/17  6:01 PM. Always use your most recent med list.          Albuterol Sulfate 108 (90 Base) MCG/ACT Aepb Commonly known as:  PROAIR RESPICLICK Inhale 2 puffs into the lungs every 4 (four) hours as needed.   Carbinoxamine Maleate 6 MG Tabs Commonly known as:  RYVENT Take 6 mg by mouth every 8 (eight) hours as needed.   dextromethorphan-guaiFENesin 30-600 MG 12hr tablet Commonly known as:  MUCINEX DM Take 1 tablet by mouth 2 (two) times daily.   diphenhydrAMINE 25 MG tablet Commonly known as:  BENADRYL Take 25 mg by mouth every 6 (six) hours as needed.   fluticasone 50 MCG/ACT nasal spray Commonly known as:  FLONASE Place 1 spray into both nostrils daily.   Fluticasone Propionate 93 MCG/ACT Exhu Commonly known as:  XHANCE Place 2 puffs into the nose 2 (two) times daily.   montelukast 10 MG  tablet Commonly known as:  SINGULAIR Take 1 tablet (10 mg total) by mouth at bedtime.       Known medication allergies: No Known Allergies  I appreciate the opportunity to take part in Floris's care. Please do not hesitate to contact me with questions.  Sincerely,   R. Jorene Guest, MD

## 2017-10-29 NOTE — Assessment & Plan Note (Addendum)
   Aeroallergen avoidance measures have been discussed and provided in written form.  A prescription has been provided for RyVent (carbinoxamine maleate) 6mg  every 6-8 hours as needed.  A prescription has been provided for montelukast 10 mg daily at bedtime.  A [facility], 2 actuations per nostril twice a day. Proper technique has been discussed and demonstrated.  Nasal saline lavage (NeilMed) has been recommended as needed and prior to medicated nasal sprays along with instructions for proper administration.  If allergen avoidance measures and medications fail to adequately relieve symptoms, aeroallergen immunotherapy will be considered.

## 2017-10-29 NOTE — Patient Instructions (Addendum)
Allergic rhinitis  Aeroallergen avoidance measures have been discussed and provided in written form.  A prescription has been provided for RyVent (carbinoxamine maleate) 6mg  every 6-8 hours as needed.  A prescription has been provided for montelukast 10 mg daily at bedtime.  A prescription has been provided for [facility], 2 actuations per nostril twice a day. Proper technique has been discussed and demonstrated.  Nasal saline lavage (NeilMed) has been recommended as needed and prior to medicated nasal sprays along with instructions for proper administration.  If allergen avoidance measures and medications fail to adequately relieve symptoms, aeroallergen immunotherapy will be considered.  Mild intermittent asthma  Montelukast has been prescribed (as above).  A prescription has been provided for ProAir Respiclick, 1-2 inhalations every 4-6 hours as needed.  Subjective and objective measures of pulmonary function will be followed and the treatment plan will be adjusted accordingly.  Cough, persistent The most common causes of chronic cough include the following: upper airway cough syndrome (UACS) which is caused by variety of rhinosinus conditions; asthma; gastroesophageal reflux disease (GERD); chronic bronchitis from cigarette smoking or other inhaled environmental irritants; non-asthmatic eosinophilic bronchitis; and bronchiectasis. In prospective studies, these conditions have accounted for up to 94% of the causes of chronic cough in immunocompetent adults. The history and physical examination suggest that her cough is multifactorial with contribution from his nasal drainage and bronchial hyperresponsiveness. We will address these issues at this time.   A prescription has been provided for a flutter valve to be used as needed to break the coughing cycle.  Treatment plan as outlined above.    We will regroup in 2 months to assess treatment response and adjust therapy  accordingly.   Return in about 2 months (around 12/27/2017), or if symptoms worsen or fail to improve.  Control of House Dust Mite Allergen  House dust mites play a major role in allergic asthma and rhinitis.  They occur in environments with high humidity wherever human skin, the food for dust mites is found. High levels have been detected in dust obtained from mattresses, pillows, carpets, upholstered furniture, bed covers, clothes and soft toys.  The principal allergen of the house dust mite is found in its feces.  A gram of dust may contain 1,000 mites and 250,000 fecal particles.  Mite antigen is easily measured in the air during house cleaning activities.    1. Encase mattresses, including the box spring, and pillow, in an air tight cover.  Seal the zipper end of the encased mattresses with wide adhesive tape. 2. Wash the bedding in water of 130 degrees Farenheit weekly.  Avoid cotton comforters/quilts and flannel bedding: the most ideal bed covering is the dacron comforter. 3. Remove all upholstered furniture from the bedroom. 4. Remove carpets, carpet padding, rugs, and non-washable window drapes from the bedroom.  Wash drapes weekly or use plastic window coverings. 5. Remove all non-washable stuffed toys from the bedroom.  Wash stuffed toys weekly. 6. Have the room cleaned frequently with a vacuum cleaner and a damp dust-mop.  The patient should not be in a room which is being cleaned and should wait 1 hour after cleaning before going into the room. 7. Close and seal all heating outlets in the bedroom.  Otherwise, the room will become filled with dust-laden air.  An electric heater can be used to heat the room. 8. Reduce indoor humidity to less than 50%.  Do not use a humidifier.  Control of Dog or Cat Allergen  Avoidance is the  best way to manage a dog or cat allergy. If you have a dog or cat and are allergic to dog or cats, consider removing the dog or cat from the home. If you have a  dog or cat but don't want to find it a new home, or if your family wants a pet even though someone in the household is allergic, here are some strategies that may help keep symptoms at bay:  1. Keep the pet out of your bedroom and restrict it to only a few rooms. Be advised that keeping the dog or cat in only one room will not limit the allergens to that room. 2. Don't pet, hug or kiss the dog or cat; if you do, wash your hands with soap and water. 3. High-efficiency particulate air (HEPA) cleaners run continuously in a bedroom or living room can reduce allergen levels over time. 4. Place electrostatic material sheet in the air inlet vent in the bedroom. 5. Regular use of a high-efficiency vacuum cleaner or a central vacuum can reduce allergen levels. 6. Giving your dog or cat a bath at least once a week can reduce airborne allergen.

## 2017-10-29 NOTE — Assessment & Plan Note (Signed)
The most common causes of chronic cough include the following: upper airway cough syndrome (UACS) which is caused by variety of rhinosinus conditions; asthma; gastroesophageal reflux disease (GERD); chronic bronchitis from cigarette smoking or other inhaled environmental irritants; non-asthmatic eosinophilic bronchitis; and bronchiectasis. In prospective studies, these conditions have accounted for up to 94% of the causes of chronic cough in immunocompetent adults. The history and physical examination suggest that her cough is multifactorial with contribution from his nasal drainage and bronchial hyperresponsiveness. We will address these issues at this time.   A prescription has been provided for a flutter valve to be used as needed to break the coughing cycle.  Treatment plan as outlined above.    We will regroup in 2 months to assess treatment response and adjust therapy accordingly.

## 2018-01-08 ENCOUNTER — Ambulatory Visit: Payer: BC Managed Care – PPO | Admitting: Allergy and Immunology

## 2018-01-08 ENCOUNTER — Encounter: Payer: Self-pay | Admitting: Allergy and Immunology

## 2018-01-08 VITALS — BP 130/90 | HR 86 | Temp 98.4°F | Resp 18 | Ht 67.0 in | Wt 269.4 lb

## 2018-01-08 DIAGNOSIS — R05 Cough: Secondary | ICD-10-CM

## 2018-01-08 DIAGNOSIS — J3089 Other allergic rhinitis: Secondary | ICD-10-CM | POA: Diagnosis not present

## 2018-01-08 DIAGNOSIS — R053 Chronic cough: Secondary | ICD-10-CM

## 2018-01-08 DIAGNOSIS — J4520 Mild intermittent asthma, uncomplicated: Secondary | ICD-10-CM

## 2018-01-08 MED ORDER — CARBINOXAMINE MALEATE 4 MG PO TABS: ORAL_TABLET | ORAL | 5 refills | Status: DC

## 2018-01-08 NOTE — Assessment & Plan Note (Signed)
Well-controlled.  Continue montelukast 10 mg daily bedtime and albuterol HFA, 1-2 inhalations every 4-6 hours as needed and 15 minutes prior to exercise.  Subjective and objective measures of pulmonary function will be followed and the treatment plan will be adjusted accordingly. 

## 2018-01-08 NOTE — Assessment & Plan Note (Signed)
Improved/resolved.  Treatment plan as outlined above. 

## 2018-01-08 NOTE — Assessment & Plan Note (Signed)
Improved.   Continue appropriate allergen avoidance measures, carbinoxamine as needed, Xhance nasal spray if needed, and nasal saline irrigation.  If allergen avoidance measures and medications fail to adequately relieve symptoms, aeroallergen immunotherapy will be considered.

## 2018-01-08 NOTE — Progress Notes (Signed)
Follow-up Note  RE: Vanessa SanesCara N Merritt MRN: [id number] DOB: 06/17/1991 Date of Office Visit: 01/08/2018  Primary care provider: Dianne DunAron, Talia M, MD Referring provider: Dianne DunAron, Talia M, MD  History of present illness: Vanessa FrayCara Merritt is a 27 y.o. female  with asthma and allergic rhinitis presenting today for follow-up.  She was previously seen in this clinic for her initial evaluation on October 29, 2017.  She reports that her cough and postnasal drainage have been "much, much better."  She believes that taking the carbinoxamine brought about significant symptom reduction.  She has only required albuterol rescue on one occasion in the interval since her previous visit and that was when she was cleaning out her rabbits cage.  In addition, she recently has only experienced nasal congestion during/after cleaning out the rabbit cage.  She has no complaints today.   Assessment and plan: Mild intermittent asthma Well-controlled.  Continue montelukast 10 mg daily bedtime and albuterol HFA, 1-2 inhalations every 4-6 hours as needed and 15 minutes prior to exercise.  Subjective and objective measures of pulmonary function will be followed and the treatment plan will be adjusted accordingly.  Allergic rhinitis Improved.   Continue appropriate allergen avoidance measures, carbinoxamine as needed, Xhance nasal spray if needed, and nasal saline irrigation.  If allergen avoidance measures and medications fail to adequately relieve symptoms, aeroallergen immunotherapy will be considered.  Cough, persistent Improved/resolved.  Treatment plan as outlined above.   Diagnostics: Spirometry:  Normal with an FEV1 of 105% predicted.  Please see scanned spirometry results for details.    Physical examination: Blood pressure 130/90, pulse 86, temperature 98.4 F (36.9 C), temperature source Oral, resp. rate 18, height 5\' 7"  (1.702 m), weight 269 lb 6.4 oz (122.2 kg), SpO2 98 %.  General: Alert, interactive, in  no acute distress. HEENT: TMs pearly gray, turbinates minimally edematous without discharge, post-pharynx unremarkable. Neck: Supple without lymphadenopathy. Lungs: Clear to auscultation without wheezing, rhonchi or rales. CV: Normal S1, S2 without murmurs. Skin: Warm and dry, without lesions or rashes.  The following portions of the patient's history were reviewed and updated as appropriate: allergies, current medications, past family history, past medical history, past social history, past surgical history and problem list.  Allergies as of 01/08/2018   No Known Allergies     Medication List        Accurate as of 01/08/18  4:08 PM. Always use your most recent med list.          Albuterol Sulfate 108 (90 Base) MCG/ACT Aepb Commonly known as:  PROAIR RESPICLICK Inhale 2 puffs into the lungs every 4 (four) hours as needed.   Carbinoxamine Maleate 6 MG Tabs Commonly known as:  RYVENT Take 6 mg by mouth every 8 (eight) hours as needed.   Carbinoxamine Maleate 4 MG Tabs Take one and one-half tablets (6 mg total) every 8 hours if needed.   dextromethorphan-guaiFENesin 30-600 MG 12hr tablet Commonly known as:  MUCINEX DM Take 1 tablet by mouth 2 (two) times daily.   diphenhydrAMINE 25 MG tablet Commonly known as:  BENADRYL Take 25 mg by mouth every 6 (six) hours as needed.   fluticasone 50 MCG/ACT nasal spray Commonly known as:  FLONASE Place 1 spray into both nostrils daily.   Fluticasone Propionate 93 MCG/ACT Exhu Commonly known as:  XHANCE Place 2 puffs into the nose 2 (two) times daily.   montelukast 10 MG tablet Commonly known as:  SINGULAIR Take 1 tablet (10 mg total) by mouth  at bedtime.       No Known Allergies  I appreciate the opportunity to take part in Kathelene's care. Please do not hesitate to contact me with questions.  Sincerely,   R. Jorene Guest, MD

## 2018-01-08 NOTE — Patient Instructions (Signed)
Mild intermittent asthma Well-controlled.  Continue montelukast 10 mg daily bedtime and albuterol HFA, 1-2 inhalations every 4-6 hours as needed and 15 minutes prior to exercise.  Subjective and objective measures of pulmonary function will be followed and the treatment plan will be adjusted accordingly.  Allergic rhinitis Improved.   Continue appropriate allergen avoidance measures, carbinoxamine as needed, Xhance nasal spray if needed, and nasal saline irrigation.  If allergen avoidance measures and medications fail to adequately relieve symptoms, aeroallergen immunotherapy will be considered.  Cough, persistent Improved/resolved.  Treatment plan as outlined above.   Return in about 6 months (around 07/11/2018), or if symptoms worsen or fail to improve.

## 2018-02-02 ENCOUNTER — Ambulatory Visit: Payer: BC Managed Care – PPO | Admitting: Family Medicine

## 2018-02-02 ENCOUNTER — Encounter: Payer: Self-pay | Admitting: *Deleted

## 2018-02-02 ENCOUNTER — Encounter: Payer: Self-pay | Admitting: Family Medicine

## 2018-02-02 VITALS — BP 142/88 | HR 85 | Temp 98.3°F | Wt 262.5 lb

## 2018-02-02 DIAGNOSIS — J069 Acute upper respiratory infection, unspecified: Secondary | ICD-10-CM

## 2018-02-02 NOTE — Progress Notes (Signed)
Subjective:    Patient ID: Vanessa Merritt, female    DOB: 22-Jun-1991, 27 y.o.   MRN: [id number]  HPI This is a 27 yo female who presents today with nasal congestion and sinus pressure.  She had fever to 102.3 two days ago. Felt fine yesterday but last night began having sinus pain and pressure.  Has been taking ibuprofen/tylenol for fever, montelukast, sudafed. Some improvement. Little nasal drainage, some congestion, right nostril closed, no sore throat, some ear pressure, no cough. Feels ok during the day and symptoms worsen at night.  She is a [employer].   Past Medical History:  Diagnosis Date  . Allergic rhinitis   . Back pain   . Childhood asthma    Past Surgical History:  Procedure Laterality Date  . NO PAST SURGERIES     Family History  Problem Relation Age of Onset  . Hypothyroidism Mother   . Hyperlipidemia Mother   . Asthma Mother   . Allergic rhinitis Mother   . Hypertension Father   . Angioedema Neg Hx   . Eczema Neg Hx   . Immunodeficiency Neg Hx   . Urticaria Neg Hx    Social History   Tobacco Use  . Smoking status: Never Smoker  . Smokeless tobacco: Never Used  Substance Use Topics  . Alcohol use: Yes    Alcohol/week: 1.2 oz    Types: 1 Glasses of wine, 1 Cans of beer per week    Comment: occasional ETOH  . Drug use: No      Review of Systems Per HPI    Objective:   Physical Exam  Constitutional: She is oriented to person, place, and time. She appears well-developed and well-nourished. No distress.  HENT:  Head: Normocephalic and atraumatic.  Right Ear: Tympanic membrane, external ear and ear canal normal.  Left Ear: Tympanic membrane, external ear and ear canal normal.  Nose: Mucosal edema and rhinorrhea present.  Mouth/Throat: Mucous membranes are normal. Posterior oropharyngeal erythema (mild with post nasal drainage) present. No oropharyngeal exudate, posterior oropharyngeal edema or tonsillar abscesses.  Eyes: Conjunctivae are  normal.  Neck: Normal range of motion. Neck supple.  Cardiovascular: Normal rate, regular rhythm and normal heart sounds.  Pulmonary/Chest: Effort normal and breath sounds normal.  Lymphadenopathy:    She has no cervical adenopathy.  Neurological: She is alert and oriented to person, place, and time.  Skin: Skin is warm and dry. She is not diaphoretic.  Psychiatric: She has a normal mood and affect. Her behavior is normal. Judgment and thought content normal.  Vitals reviewed.     BP (!) 142/88   Pulse 85   Temp 98.3 F (36.8 C) (Oral)   Wt 262 lb 8 oz (119.1 kg)   LMP 01/19/2018 (Approximate)   SpO2 98%   BMI 41.11 kg/m  Wt Readings from Last 3 Encounters:  02/02/18 262 lb 8 oz (119.1 kg)  01/08/18 269 lb 6.4 oz (122.2 kg)  10/29/17 267 lb 3.2 oz (121.2 kg)       Assessment & Plan:  1. Viral URI - Provided written and verbal information regarding diagnosis and treatment. -  Patient Instructions  Good to see you today  Please add daily antihistamine during allergy season  Can use afrin type nasal spray twice a day for a maximum of 4 days  Sudafed as needed for congestion  Tylenol/Advil for fever, pain   - RTC precautions reviewed  Olean Ree, FNP-BC  [facility]  [place], [facility]  02/02/2018 1:51 PM

## 2018-02-02 NOTE — Patient Instructions (Signed)
Good to see you today  Please add daily antihistamine during allergy season  Can use afrin type nasal spray twice a day for a maximum of 4 days  Sudafed as needed for congestion  Tylenol/Advil for fever, pain

## 2018-05-18 ENCOUNTER — Ambulatory Visit: Payer: BC Managed Care – PPO | Admitting: Family Medicine

## 2018-05-18 ENCOUNTER — Encounter: Payer: Self-pay | Admitting: Family Medicine

## 2018-05-18 VITALS — BP 132/90 | HR 99 | Temp 97.5°F | Ht 66.5 in | Wt 267.8 lb

## 2018-05-18 DIAGNOSIS — N938 Other specified abnormal uterine and vaginal bleeding: Secondary | ICD-10-CM

## 2018-05-18 DIAGNOSIS — R41840 Attention and concentration deficit: Secondary | ICD-10-CM

## 2018-05-18 MED ORDER — NORETHIN-ETH ESTRAD-FE BIPHAS 1 MG-10 MCG / 10 MCG PO TABS: 1.0000 | ORAL_TABLET | Freq: Every day | ORAL | 4 refills | Status: DC

## 2018-05-18 NOTE — Patient Instructions (Signed)
Stop at desk and see referral coordinator for ADD testing appointment Schedule your complete physical for a couple of weeks after your testing appointment  Oral Contraception Use Oral contraceptive pills (OCPs) are medicines taken to prevent pregnancy. OCPs work by preventing the ovaries from releasing eggs. The hormones in OCPs also cause the cervical mucus to thicken, preventing the sperm from entering the uterus. The hormones also cause the uterine lining to become thin, not allowing a fertilized egg to attach to the inside of the uterus. OCPs are highly effective when taken exactly as prescribed. However, OCPs do not prevent sexually transmitted diseases (STDs). Safe sex practices, such as using condoms along with an OCP, can help prevent STDs. Before taking OCPs, you may have a physical exam and Pap test. Your health care provider may also order blood tests if necessary. Your health care provider will make sure you are a good candidate for oral contraception. Discuss with your health care provider the possible side effects of the OCP you may be prescribed. When starting an OCP, it can take 2 to 3 months for the body to adjust to the changes in hormone levels in your body. How to take oral contraceptive pills Your health care provider may advise you on how to start taking the first cycle of OCPs. Otherwise, you can:  Start on day 1 of your menstrual period. You will not need any backup contraceptive protection with this start time.  Start on the first Sunday after your menstrual period or the day you get your prescription. In these cases, you will need to use backup contraceptive protection for the first week.  Start the pill at any time of your cycle. If you take the pill within 5 days of the start of your period, you are protected against pregnancy right away. In this case, you will not need a backup form of birth control. If you start at any other time of your menstrual cycle, you will need to  use another form of birth control for 7 days. If your OCP is the type called a minipill, it will protect you from pregnancy after taking it for 2 days (48 hours).  After you have started taking OCPs:  If you forget to take 1 pill, take it as soon as you remember. Take the next pill at the regular time.  If you miss 2 or more pills, call your health care provider because different pills have different instructions for missed doses. Use backup birth control until your next menstrual period starts.  If you use a 28-day Koc that contains inactive pills and you miss 1 of the last 7 pills (pills with no hormones), it will not matter. Throw away the rest of the non-hormone pills and start a new pill Bobby.  No matter which day you start the OCP, you will always start a new Kolodziej on that same day of the week. Have an extra Guhl of OCPs and a backup contraceptive method available in case you miss some pills or lose your OCP [place]. Follow these instructions at home:  Do not smoke.  Always use a condom to protect against STDs. OCPs do not protect against STDs.  Use a calendar to mark your menstrual period days.  Read the information and directions that came with your OCP. Talk to your health care provider if you have questions. Contact a health care provider if:  You develop nausea and vomiting.  You have abnormal vaginal discharge or bleeding.  You develop  a rash.  You miss your menstrual period.  You are losing your hair.  You need treatment for mood swings or depression.  You get dizzy when taking the OCP.  You develop acne from taking the OCP.  You become pregnant. Get help right away if:  You develop chest pain.  You develop shortness of breath.  You have an uncontrolled or severe headache.  You develop numbness or slurred speech.  You develop visual problems.  You develop pain, redness, and swelling in the legs. This information is not intended to replace advice given to  you by your health care provider. Make sure you discuss any questions you have with your health care provider. Document Released: 10/03/2011 Document Revised: 03/21/2016 Document Reviewed: 04/04/2013 Elsevier Interactive Patient Education  2017 [employer].

## 2018-05-18 NOTE — Progress Notes (Signed)
Subjective:    Patient ID: Vanessa Merritt, female    DOB: Mar 18, 1991, 27 y.o.   MRN: [id number]  HPI This is a 27 yo female who presents today to establish care. Previous patient of Dr. Dayton Martes.  Lives alone. Has a pet rabbit. 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  Enjoys crafting, taking baths.   Last CPE- 11/16 Pap- never Immunizations- up to date Flu- annual Eye- not regular Dental- regular Exercise- some walking  OCPs- was previously on Yaz for cycle control and acne. Came off due to not being covered by insurance. Is interested in going back on. No migraines, no history of DVT/PE. Not currently sexually active.   ADD- was diagnosed as a child, was not medicated. Has difficulty concentrating with her job. Is interested in testing and possible medication.   Asthma of childhood- rarely uses albuterol, only if URI  Past Medical History:  Diagnosis Date  . Allergic rhinitis   . Back pain   . Childhood asthma    Past Surgical History:  Procedure Laterality Date  . NO PAST SURGERIES     Family History  Problem Relation Age of Onset  . Hypothyroidism Mother   . Hyperlipidemia Mother   . Asthma Mother   . Allergic rhinitis Mother   . Hypertension Father   . Angioedema Neg Hx   . Eczema Neg Hx   . Immunodeficiency Neg Hx   . Urticaria Neg Hx    Social History   Tobacco Use  . Smoking status: Never Smoker  . Smokeless tobacco: Never Used  Substance Use Topics  . Alcohol use: Yes    Alcohol/week: 1.2 oz    Types: 1 Glasses of wine, 1 Cans of beer per week    Comment: occasional ETOH  . Drug use: No      Review of Systems Per HPI    Objective:   Physical Exam  Constitutional: She is oriented to person, place, and time. She appears well-developed and well-nourished. No distress.  HENT:  Head: Normocephalic and atraumatic.  Eyes: Conjunctivae are normal.  Neck: Normal range of motion.  Cardiovascular: Normal rate.    Pulmonary/Chest: Effort normal.  Neurological: She is alert and oriented to person, place, and time.  Skin: Skin is warm and dry. She is not diaphoretic.  Psychiatric: She has a normal mood and affect. Her behavior is normal. Judgment and thought content normal.  Vitals reviewed.     BP 132/90 (BP Location: Right Arm, Patient Position: Sitting, Cuff Size: Large)   Pulse 99   Temp (!) 97.5 F (36.4 C) (Oral)   Ht 5' 6.5" (1.689 m)   Wt 267 lb 12 oz (121.5 kg)   LMP 05/11/2018   SpO2 100%   BMI 42.57 kg/m  Wt Readings from Last 3 Encounters:  05/18/18 267 lb 12 oz (121.5 kg)  02/02/18 262 lb 8 oz (119.1 kg)  01/08/18 269 lb 6.4 oz (122.2 kg)       Assessment & Plan:  1. Disturbed concentration - Will send for testing, she will follow up after testing to discuss results and whether medication is indicated - Discussed non pharmacologic interventions to aid in concentration- routiness, adequate physical activity, adequate sleep, stress management - Ambulatory referral to Psychology  2. Dysfunctional uterine bleeding - Norethindrone-Ethinyl Estradiol-Fe Biphas (LO LOESTRIN FE) 1 MG-10 MCG / 10 MCG tablet; Take 1 tablet by mouth daily.  Dispense: 3 Package; Refill: 4 - Provided written and  verbal information regarding starting, what to do if missed doses and expectations with bleeding/side effects - CPE at follow up  Olean Reeeborah Gessner, FNP-BC  [facility], [facility]  05/18/2018 3:09 PM

## 2018-05-24 ENCOUNTER — Encounter: Payer: Self-pay | Admitting: Family Medicine

## 2018-07-22 ENCOUNTER — Ambulatory Visit: Payer: BC Managed Care – PPO | Admitting: Allergy and Immunology

## 2018-09-04 ENCOUNTER — Ambulatory Visit (INDEPENDENT_AMBULATORY_CARE_PROVIDER_SITE_OTHER): Payer: BC Managed Care – PPO | Admitting: Psychology

## 2018-09-04 DIAGNOSIS — F900 Attention-deficit hyperactivity disorder, predominantly inattentive type: Secondary | ICD-10-CM

## 2018-09-10 ENCOUNTER — Ambulatory Visit: Payer: BC Managed Care – PPO | Admitting: Psychology

## 2018-10-19 ENCOUNTER — Ambulatory Visit (INDEPENDENT_AMBULATORY_CARE_PROVIDER_SITE_OTHER): Payer: BC Managed Care – PPO | Admitting: Psychology

## 2018-10-19 DIAGNOSIS — F900 Attention-deficit hyperactivity disorder, predominantly inattentive type: Secondary | ICD-10-CM | POA: Diagnosis not present

## 2018-10-30 ENCOUNTER — Ambulatory Visit: Payer: BC Managed Care – PPO | Admitting: Family Medicine

## 2018-10-30 ENCOUNTER — Encounter: Payer: Self-pay | Admitting: Family Medicine

## 2018-10-30 VITALS — BP 124/90 | HR 85 | Temp 97.9°F | Ht 67.0 in | Wt 263.0 lb

## 2018-10-30 DIAGNOSIS — F900 Attention-deficit hyperactivity disorder, predominantly inattentive type: Secondary | ICD-10-CM | POA: Diagnosis not present

## 2018-10-30 MED ORDER — AMPHETAMINE-DEXTROAMPHET ER 20 MG PO CP24: 20.0000 mg | ORAL_CAPSULE | ORAL | 0 refills | Status: DC

## 2018-10-30 NOTE — Patient Instructions (Signed)
Good to see you today  Please follow up in 4-6 weeks  Amphetamine; Dextroamphetamine extended-release capsules What is this medicine? AMPHETAMINE; DEXTROAMPHETAMINE (am FET a meen; dex troe am FET a meen) is used to treat attention-deficit hyperactivity disorder (ADHD). Federal law prohibits giving this medicine to any person other than the person for whom it was prescribed. Do not share this medicine with anyone else. This medicine may be used for other purposes; ask your health care provider or pharmacist if you have questions. COMMON BRAND NAME(S): Adderall XR, Mydayis What should I tell my health care provider before I take this medicine? They need to know if you have any of these conditions: -anxiety or panic attacks -circulation problems in fingers and toes -glaucoma -hardening or blockages of the arteries or heart blood vessels -heart disease or a heart defect -high blood pressure -history of a drug or alcohol abuse problem -history of stroke -kidney disease -liver disease -mental illness -seizures -suicidal thoughts, plans, or attempt; a previous suicide attempt by you or a family member -thyroid disease -Tourette's syndrome -an unusual or allergic reaction to dextroamphetamine, other amphetamines, other medicines, foods, dyes, or preservatives -pregnant or trying to get pregnant -breast-feeding How should I use this medicine? Take this medicine by mouth with a glass of water. Follow the directions on the prescription label. This medicine is taken just one time per day, usually in the morning after waking up. Take with or without food. Do not chew or crush this medicine. You may open the capsules and sprinkle the medicine on a spoonful of applesauce. If sprinkled on applesauce, take the dose immediately and do not crush or chew. Do not take your medicine more often than directed. A special MedGuide will be given to you by the pharmacist with each prescription and refill. Be  sure to read this information carefully each time. Talk to your pediatrician regarding the use of this medicine in children. While this drug may be prescribed for children as young as 6 years for selected conditions, precautions do apply. Some extended-release capsules are recommended for use only in children 35 years of age and older. Overdosage: If you think you have taken too much of this medicine contact a poison control center or emergency room at once. NOTE: This medicine is only for you. Do not share this medicine with others. What if I miss a dose? If you miss a dose, take it as soon as you can in the morning, but do not take it later in the day because it can cause trouble sleeping. If it is almost time for your next dose, take only that dose. Do not take double or extra doses. What may interact with this medicine? Do not take this medicine with any of the following medications: -MAOIs like Carbex, Eldepryl, Marplan, Nardil, and Parnate -other stimulant medicines for attention disorders This medicine may also interact with the following medications: -acetazolamide -alcohol -ammonium chloride -antacids -ascorbic acid -atomoxetine -caffeine -certain medicines for blood pressure -certain medicines for depression, anxiety, or psychotic disturbances -certain medicines for seizures like carbamazepine, phenobarbital, phenytoin -certain medicines for stomach problems like cimetidine, ranitidine, famotidine, esomeprazole, omeprazole, lansoprazole, pantoprazole -lithium -medicines for colds and breathing difficulties -medicines for diabetes -medicines or dietary supplements for weight loss or to stay awake -methenamine -narcotic medicines for pain -quinidine -ritonavir -sodium bicarbonate -St. John's wort This list may not describe all possible interactions. Give your health care provider a list of all the medicines, herbs, non-prescription drugs, or dietary supplements you  use. Also  tell them if you smoke, drink alcohol, or use illegal drugs. Some items may interact with your medicine. What should I watch for while using this medicine? Visit your doctor or health care professional for regular checks on your progress. This prescription requires that you follow special procedures with your doctor and pharmacy. You will need to have a new written prescription from your doctor every time you need a refill. This medicine may affect your concentration, or hide signs of tiredness. Until you know how this medicine affects you, do not drive, ride a bicycle, use machinery, or do anything that needs mental alertness. Alcohol should be avoided with some brands of this medicine. Talk to your doctor or health care professional if you have questions. Tell your doctor or health care professional if this medicine loses its effects, or if you feel you need to take more than the prescribed amount. Do not change the dosage without talking to your doctor or health care professional. Decreased appetite is a common side effect when starting this medicine. Eating small, frequent meals or snacks can help. Talk to your doctor if you continue to have poor eating habits. Height and weight growth of a child taking this medicine will be monitored closely. Do not take this medicine close to bedtime. It may prevent you from sleeping. If you are going to need surgery, an MRI, a CT scan, or other procedure, tell your doctor that you are taking this medicine. You may need to stop taking this medicine before the procedure. Tell your doctor or healthcare professional right away if you notice unexplained wounds on your fingers and toes while taking this medicine. You should also tell your healthcare provider if you experience numbness or pain, changes in the skin color, or sensitivity to temperature in your fingers or toes. What side effects may I notice from receiving this medicine? Side effects that you should report to  your doctor or health care professional as soon as possible: -allergic reactions like skin rash, itching or hives, swelling of the face, lips, or tongue -anxious -breathing problems -changes in emotions or moods -changes in vision -chest pain or chest tightness -fast, irregular heartbeat -fingers or toes feel numb, cool, painful -hallucination, loss of contact with reality -high blood pressure -males: prolonged or painful erection -seizures -signs and symptoms of serotonin syndrome like confusion, increased sweating, fever, tremor, stiff muscles, diarrhea -signs and symptoms of a stroke like changes in vision; confusion; trouble speaking or understanding; severe headaches; sudden numbness or weakness of the face, arm or leg; trouble walking; dizziness; loss of balance or coordination -suicidal thoughts or other mood changes -uncontrollable head, mouth, neck, arm, or leg movements Side effects that usually do not require medical attention (report to your doctor or health care professional if they continue or are bothersome): -dry mouth -headache -irritability -loss of appetite -nausea -trouble sleeping -weight loss This list may not describe all possible side effects. Call your doctor for medical advice about side effects. You may report side effects to FDA at 1-800-FDA-1088. Where should I keep my medicine? Keep out of the reach of children. This medicine can be abused. Keep your medicine in a safe place to protect it from theft. Do not share this medicine with anyone. Selling or giving away this medicine is dangerous and against the law. Store at room temperature between 15 and 30 degrees C (59 and 86 degrees F). Keep container tightly closed. Protect from light. Throw away any unused medicine after  the expiration date. NOTE: This sheet is a summary. It may not cover all possible information. If you have questions about this medicine, talk to your doctor, pharmacist, or health care  provider.  2019 Elsevier/Gold Standard (2016-12-08 13:37:27)

## 2018-10-30 NOTE — Progress Notes (Signed)
   Subjective:    Patient ID: Vanessa Merritt, female    DOB: 1991-02-19, 28 y.o.   MRN: [id number]  HPI This is a 28 yo female who presents today to discuss results of recent ADHD testing. Patient feels that she is currently doing all of the lifestyle recommendations and is interested in trying medication. Her mother has ADHD and takes prn stimulant medication.  She denies any chest pain, SOB, palpitations, anxiety.   Past Medical History:  Diagnosis Date  . Allergic rhinitis   . Back pain   . Childhood asthma    Past Surgical History:  Procedure Laterality Date  . NO PAST SURGERIES     Family History  Problem Relation Age of Onset  . Hypothyroidism Mother   . Hyperlipidemia Mother   . Asthma Mother   . Allergic rhinitis Mother   . Hypertension Father   . Angioedema Neg Hx   . Eczema Neg Hx   . Immunodeficiency Neg Hx   . Urticaria Neg Hx    Social History   Tobacco Use  . Smoking status: Never Smoker  . Smokeless tobacco: Never Used  Substance Use Topics  . Alcohol use: Yes    Alcohol/week: 2.0 standard drinks    Types: 1 Glasses of wine, 1 Cans of beer per week    Comment: occasional ETOH  . Drug use: No      Review of Systems Per HPI    Objective:   Physical Exam Physical Exam  Constitutional: Oriented to person, place, and time. She appears well-developed and well-nourished.  HENT:  Head: Normocephalic and atraumatic.  Eyes: Conjunctivae are normal.  Neck: Normal range of motion. Neck supple.  Cardiovascular: Normal rate, regular rhythm and normal heart sounds.   Pulmonary/Chest: Effort normal and breath sounds normal.  Musculoskeletal: Normal range of motion.  Neurological: Alert and oriented to person, place, and time.  Skin: Skin is warm and dry.  Psychiatric: Normal mood and affect. Behavior is normal. Judgment and thought content normal.  Vitals reviewed.     BP 124/90 (BP Location: Left Arm, Patient Position: Sitting, Cuff Size: Large)    Pulse 85   Temp 97.9 F (36.6 C) (Oral)   Ht 5\' 7"  (1.702 m)   Wt 263 lb (119.3 kg)   SpO2 98%   BMI 41.19 kg/m  Wt Readings from Last 3 Encounters:  10/30/18 263 lb (119.3 kg)  05/18/18 267 lb 12 oz (121.5 kg)  02/02/18 262 lb 8 oz (119.1 kg)       Assessment & Plan:  1. Attention deficit hyperactivity disorder (ADHD), predominantly inattentive type - Report reviewed and patient agreeable to having scanned to EMR - discussed medication types and patient would like to try stimulant medication - Discussed potential side effects and provided written information about medication - patient signed controlled substance contract and agreed to UDS if requested - follow up in 4-6 weeks - amphetamine-dextroamphetamine (ADDERALL XR) 20 MG 24 hr capsule; Take 1 capsule (20 mg total) by mouth every morning.  Dispense: 30 capsule; Refill: 0 - amphetamine-dextroamphetamine (ADDERALL XR) 20 MG 24 hr capsule; Take 1 capsule (20 mg total) by mouth every morning.  Dispense: 30 capsule; Refill: 0   Olean Ree, FNP-BC  [facility], [facility]  10/30/2018 4:49 PM

## 2018-11-01 ENCOUNTER — Encounter: Payer: Self-pay | Admitting: Family Medicine

## 2018-11-03 ENCOUNTER — Telehealth: Payer: Self-pay

## 2018-11-03 NOTE — Telephone Encounter (Signed)
Started PA for Adderall per fax received from Cover my meds. Awaiting response and possibly additional questions per message from cover my meds.

## 2018-11-04 NOTE — Telephone Encounter (Signed)
Answered additional questions on cover my meds in regards to this request.

## 2018-11-05 NOTE — Telephone Encounter (Signed)
Noted! Thank you

## 2018-11-05 NOTE — Telephone Encounter (Signed)
Spoke with pt and she is aware of the information. Pt picked up the medication yesterday.

## 2018-11-05 NOTE — Telephone Encounter (Signed)
Called patient again and no answer. Called patient's mother and left her a message advising of below information (ok per DPR).

## 2018-11-05 NOTE — Telephone Encounter (Signed)
Received PA approval for patient's Adderall XR 20 mg 1 capsule daily starting on 11/04/2018 through 11/04/2021. Faxing notification to [facility] [place]. Called patient on cell phone number but voicemail was full and I could not leave a message.

## 2018-11-30 ENCOUNTER — Ambulatory Visit: Payer: BC Managed Care – PPO | Admitting: Family Medicine

## 2018-11-30 ENCOUNTER — Encounter: Payer: Self-pay | Admitting: Family Medicine

## 2018-11-30 VITALS — BP 124/84 | HR 95 | Temp 98.8°F | Ht 67.0 in | Wt 249.0 lb

## 2018-11-30 DIAGNOSIS — J069 Acute upper respiratory infection, unspecified: Secondary | ICD-10-CM

## 2018-11-30 NOTE — Patient Instructions (Signed)

## 2018-11-30 NOTE — Progress Notes (Signed)
   Subjective:     Vanessa Merritt is a 28 y.o. female presenting for URI (woke up this morning with cough, congestion, pressure in her ears, head pressure, vomited this morning(just phlegm came out), sneezing. Took Claritin, Sudafed, Benadryl today. Temp ealrier today was 100.0.)     URI   This is a new problem. The current episode started today. The problem has been gradually worsening. There has been no fever. Associated symptoms include congestion, diarrhea, a plugged ear sensation, rhinorrhea, sinus pain, sneezing, a sore throat and vomiting. Pertinent negatives include no abdominal pain, chest pain, coughing or wheezing. She has tried antihistamine and decongestant for the symptoms.   Made the appointment due to concern for fever - in the past would have strept if fever + vomiting.     Review of Systems  Constitutional: Positive for chills, fatigue and fever.  HENT: Positive for congestion, rhinorrhea, sinus pressure, sinus pain, sneezing and sore throat.   Respiratory: Negative for cough, shortness of breath and wheezing.   Cardiovascular: Negative for chest pain.  Gastrointestinal: Positive for diarrhea and vomiting. Negative for abdominal pain.  Musculoskeletal: Negative for arthralgias and myalgias.     Social History   Tobacco Use  Smoking Status Never Smoker  Smokeless Tobacco Never Used        Objective:    BP Readings from Last 3 Encounters:  11/30/18 124/84  10/30/18 124/90  05/18/18 132/90   Wt Readings from Last 3 Encounters:  11/30/18 249 lb (112.9 kg)  10/30/18 263 lb (119.3 kg)  05/18/18 267 lb 12 oz (121.5 kg)    BP 124/84   Pulse 95   Temp 98.8 F (37.1 C)   Ht 5\' 7"  (1.702 m)   Wt 249 lb (112.9 kg)   LMP 11/30/2018   SpO2 96%   BMI 39.00 kg/m    Physical Exam        Assessment & Plan:   Problem List Items Addressed This Visit    None       No follow-ups on file.  Lynnda Child, MD

## 2018-12-14 ENCOUNTER — Ambulatory Visit: Payer: BC Managed Care – PPO | Admitting: Family Medicine

## 2018-12-14 ENCOUNTER — Encounter: Payer: Self-pay | Admitting: Family Medicine

## 2018-12-14 DIAGNOSIS — F900 Attention-deficit hyperactivity disorder, predominantly inattentive type: Secondary | ICD-10-CM

## 2018-12-14 MED ORDER — AMPHETAMINE-DEXTROAMPHET ER 20 MG PO CP24: 20.0000 mg | ORAL_CAPSULE | ORAL | 0 refills | Status: DC

## 2018-12-14 MED ORDER — AMPHETAMINE-DEXTROAMPHET ER 20 MG PO CP24: 20.0000 mg | ORAL_CAPSULE | Freq: Every day | ORAL | 0 refills | Status: DC

## 2018-12-14 NOTE — Patient Instructions (Addendum)
Good to see you today  I have sent in 3 months supply of your Adderall XR to your pharmacy  Please follow up in 6 months for your annual exam

## 2018-12-14 NOTE — Progress Notes (Signed)
Pre visit review using our clinic review tool, if applicable. No additional management support is needed unless otherwise documented below in the visit note. 

## 2018-12-14 NOTE — Progress Notes (Signed)
Subjective:    Patient ID: Vanessa Merritt, female    DOB: February 18, 1991, 28 y.o.   MRN: [id number]  HPI This is a 28 yo female who presents today for follow up of ADHD. She was started on adderall XR 20 mg 6 weeks ago. Feels more accomplished and organized. Decreased anxiety with task completion. Does not always take on weekends. Has not noticed any palpitations, insomnia, chest pain or increased anxiety. She has been watching her diet more carefully and has lost weight.    Past Medical History:  Diagnosis Date  . Allergic rhinitis   . Back pain   . Childhood asthma    Past Surgical History:  Procedure Laterality Date  . NO PAST SURGERIES     Family History  Problem Relation Age of Onset  . Hypothyroidism Mother   . Hyperlipidemia Mother   . Asthma Mother   . Allergic rhinitis Mother   . Hypertension Father   . Angioedema Neg Hx   . Eczema Neg Hx   . Immunodeficiency Neg Hx   . Urticaria Neg Hx    Social History   Tobacco Use  . Smoking status: Never Smoker  . Smokeless tobacco: Never Used  Substance Use Topics  . Alcohol use: Yes    Alcohol/week: 2.0 standard drinks    Types: 1 Glasses of wine, 1 Cans of beer per week    Comment: occasional ETOH  . Drug use: No      Review of Systems Per HPI    Objective:   Physical Exam Vitals signs reviewed.  Constitutional:      General: She is not in acute distress.    Appearance: Normal appearance. She is obese. She is not ill-appearing, toxic-appearing or diaphoretic.  HENT:     Head: Normocephalic and atraumatic.  Eyes:     Conjunctiva/sclera: Conjunctivae normal.  Cardiovascular:     Rate and Rhythm: Normal rate and regular rhythm.     Pulses: Normal pulses.     Heart sounds: Normal heart sounds.  Pulmonary:     Effort: Pulmonary effort is normal.     Breath sounds: Normal breath sounds.  Skin:    General: Skin is warm and dry.  Neurological:     Mental Status: She is alert and oriented to person, place, and  time.  Psychiatric:        Mood and Affect: Mood normal.        Behavior: Behavior normal.        Thought Content: Thought content normal.        Judgment: Judgment normal.      BP 122/62   Pulse 86   Temp 98.2 F (36.8 C) (Oral)   Ht 5\' 7"  (1.702 m)   Wt 249 lb 12.8 oz (113.3 kg)   LMP 11/30/2018   SpO2 96%   BMI 39.12 kg/m  Wt Readings from Last 3 Encounters:  12/14/18 249 lb 12.8 oz (113.3 kg)  11/30/18 249 lb (112.9 kg)  10/30/18 263 lb (119.3 kg)        Assessment & Plan:  1. Attention deficit hyperactivity disorder (ADHD), predominantly inattentive type - doing well on current dose, no side effects - amphetamine-dextroamphetamine (ADDERALL XR) 20 MG 24 hr capsule; Take 1 capsule (20 mg total) by mouth every morning.  Dispense: 30 capsule; Refill: 0 - amphetamine-dextroamphetamine (ADDERALL XR) 20 MG 24 hr capsule; Take 1 capsule (20 mg total) by mouth every morning.  Dispense: 30 capsule; Refill: 0 -  amphetamine-dextroamphetamine (ADDERALL XR) 20 MG 24 hr capsule; Take 1 capsule (20 mg total) by mouth daily.  Dispense: 30 capsule; Refill: 0 - follow up in 6 months for CPE  Olean Ree, FNP-BC  [facility] at [facility], [facility]  12/15/2018 8:10 AM

## 2018-12-15 ENCOUNTER — Encounter: Payer: Self-pay | Admitting: Family Medicine

## 2019-05-26 ENCOUNTER — Other Ambulatory Visit (INDEPENDENT_AMBULATORY_CARE_PROVIDER_SITE_OTHER): Payer: BC Managed Care – PPO

## 2019-05-26 ENCOUNTER — Other Ambulatory Visit: Payer: Self-pay | Admitting: Primary Care

## 2019-05-26 DIAGNOSIS — Z0000 Encounter for general adult medical examination without abnormal findings: Secondary | ICD-10-CM | POA: Diagnosis not present

## 2019-05-26 LAB — CBC
HCT: 38.3 % (ref 36.0–46.0)
Hemoglobin: 12.8 g/dL (ref 12.0–15.0)
MCHC: 33.5 g/dL (ref 30.0–36.0)
MCV: 86.3 fl (ref 78.0–100.0)
Platelets: 367 10*3/uL (ref 150.0–400.0)
RBC: 4.44 Mil/uL (ref 3.87–5.11)
RDW: 12.5 % (ref 11.5–15.5)
WBC: 10.9 10*3/uL — ABNORMAL HIGH (ref 4.0–10.5)

## 2019-05-26 LAB — COMPREHENSIVE METABOLIC PANEL
ALT: 20 U/L (ref 0–35)
AST: 17 U/L (ref 0–37)
Albumin: 4 g/dL (ref 3.5–5.2)
Alkaline Phosphatase: 77 U/L (ref 39–117)
BUN: 15 mg/dL (ref 6–23)
CO2: 28 mEq/L (ref 19–32)
Calcium: 9 mg/dL (ref 8.4–10.5)
Chloride: 101 mEq/L (ref 96–112)
Creatinine, Ser: 0.69 mg/dL (ref 0.40–1.20)
GFR: 102.86 mL/min (ref >60.00)
Glucose, Bld: 79 mg/dL (ref 70–99)
Potassium: 4.4 mEq/L (ref 3.5–5.1)
Sodium: 135 mEq/L (ref 135–145)
Total Bilirubin: 0.4 mg/dL (ref 0.2–1.2)
Total Protein: 6.8 g/dL (ref 6.0–8.3)

## 2019-05-26 LAB — LIPID PANEL
Cholesterol: 185 mg/dL (ref 0–200)
HDL: 42.4 mg/dL (ref >39.00)
LDL Cholesterol: 129 mg/dL — ABNORMAL HIGH (ref 0–99)
NonHDL: 142.48
Total CHOL/HDL Ratio: 4
Triglycerides: 69 mg/dL (ref 0.0–149.0)
VLDL: 13.8 mg/dL (ref 0.0–40.0)

## 2019-06-02 ENCOUNTER — Encounter: Payer: Self-pay | Admitting: Family Medicine

## 2019-06-02 ENCOUNTER — Other Ambulatory Visit: Payer: Self-pay

## 2019-06-02 ENCOUNTER — Other Ambulatory Visit (HOSPITAL_COMMUNITY)
Admission: RE | Admit: 2019-06-02 | Discharge: 2019-06-02 | Disposition: A | Discharge status: Home / Self Care | Payer: BC Managed Care – PPO | Source: Ambulatory Visit | Attending: Family Medicine | Admitting: Family Medicine

## 2019-06-02 ENCOUNTER — Ambulatory Visit (INDEPENDENT_AMBULATORY_CARE_PROVIDER_SITE_OTHER): Payer: BC Managed Care – PPO | Admitting: Family Medicine

## 2019-06-02 VITALS — BP 124/82 | HR 77 | Temp 99.4°F | Ht 67.0 in | Wt 248.0 lb

## 2019-06-02 DIAGNOSIS — Z124 Encounter for screening for malignant neoplasm of cervix: Secondary | ICD-10-CM | POA: Diagnosis not present

## 2019-06-02 DIAGNOSIS — Z0000 Encounter for general adult medical examination without abnormal findings: Secondary | ICD-10-CM | POA: Insufficient documentation

## 2019-06-02 DIAGNOSIS — N938 Other specified abnormal uterine and vaginal bleeding: Secondary | ICD-10-CM

## 2019-06-02 DIAGNOSIS — F900 Attention-deficit hyperactivity disorder, predominantly inattentive type: Secondary | ICD-10-CM | POA: Diagnosis not present

## 2019-06-02 MED ORDER — AMPHETAMINE-DEXTROAMPHET ER 20 MG PO CP24: 20.0000 mg | ORAL_CAPSULE | Freq: Every day | ORAL | 0 refills | Status: DC

## 2019-06-02 MED ORDER — AMPHETAMINE-DEXTROAMPHET ER 20 MG PO CP24: 20.0000 mg | ORAL_CAPSULE | ORAL | 0 refills | Status: DC

## 2019-06-02 MED ORDER — NORETHIN-ETH ESTRAD-FE BIPHAS 1 MG-10 MCG / 10 MCG PO TABS: 1.0000 | ORAL_TABLET | Freq: Every day | ORAL | 4 refills | Status: DC

## 2019-06-02 NOTE — Patient Instructions (Signed)
Good to see you today- Happy Birthday!!  I will send in your prescriptions. Let me know in 3 months when you need Adderall refill, follow up office visit in 6 months   Health Maintenance, Female Adopting a healthy lifestyle and getting preventive care are important in promoting health and wellness. Ask your health care provider about:  The right schedule for you to have regular tests and exams.  Things you can do on your own to prevent diseases and keep yourself healthy. What should I know about diet, weight, and exercise? Eat a healthy diet   Eat a diet that includes plenty of vegetables, fruits, low-fat dairy products, and lean protein.  Do not eat a lot of foods that are high in solid fats, added sugars, or sodium. Maintain a healthy weight Body mass index (BMI) is used to identify weight problems. It estimates body fat based on height and weight. Your health care provider can help determine your BMI and help you achieve or maintain a healthy weight. Get regular exercise Get regular exercise. This is one of the most important things you can do for your health. Most adults should:  Exercise for at least 150 minutes each week. The exercise should increase your heart rate and make you sweat (moderate-intensity exercise).  Do strengthening exercises at least twice a week. This is in addition to the moderate-intensity exercise.  Spend less time sitting. Even light physical activity can be beneficial. Watch cholesterol and blood lipids Have your blood tested for lipids and cholesterol at 26 years of age, then have this test every 5 years. Have your cholesterol levels checked more often if:  Your lipid or cholesterol levels are high.  You are older than 26 years of age.  You are at high risk for heart disease. What should I know about cancer screening? Depending on your health history and family history, you may need to have cancer screening at various ages. This may include  screening for:  Breast cancer.  Cervical cancer.  Colorectal cancer.  Skin cancer.  Lung cancer. What should I know about heart disease, diabetes, and high blood pressure? Blood pressure and heart disease  High blood pressure causes heart disease and increases the risk of stroke. This is more likely to develop in people who have high blood pressure readings, are of African descent, or are overweight.  Have your blood pressure checked: ? Every 3-5 years if you are 58-60 years of age. ? Every year if you are 46 years old or older. Diabetes Have regular diabetes screenings. This checks your fasting blood sugar level. Have the screening done:  Once every three years after age 60 if you are at a normal weight and have a low risk for diabetes.  More often and at a younger age if you are overweight or have a high risk for diabetes. What should I know about preventing infection? Hepatitis B If you have a higher risk for hepatitis B, you should be screened for this virus. Talk with your health care provider to find out if you are at risk for hepatitis B infection. Hepatitis C Testing is recommended for:  Everyone born from [birth year] through 1963.  Anyone with known risk factors for hepatitis C. Sexually transmitted infections (STIs)  Get screened for STIs, including gonorrhea and chlamydia, if: ? You are sexually active and are younger than 26 years of age. ? You are older than 26 years of age and your health care provider tells you that you are  at risk for this type of infection. ? Your sexual activity has changed since you were last screened, and you are at increased risk for chlamydia or gonorrhea. Ask your health care provider if you are at risk.  Ask your health care provider about whether you are at high risk for HIV. Your health care provider may recommend a prescription medicine to help prevent HIV infection. If you choose to take medicine to prevent HIV, you should first get  tested for HIV. You should then be tested every 3 months for as long as you are taking the medicine. Pregnancy  If you are about to stop having your period (premenopausal) and you may become pregnant, seek counseling before you get pregnant.  Take 400 to 800 micrograms (mcg) of folic acid every day if you become pregnant.  Ask for birth control (contraception) if you want to prevent pregnancy. Osteoporosis and menopause Osteoporosis is a disease in which the bones lose minerals and strength with aging. This can result in bone fractures. If you are 76 years old or older, or if you are at risk for osteoporosis and fractures, ask your health care provider if you should:  Be screened for bone loss.  Take a calcium or vitamin D supplement to lower your risk of fractures.  Be given hormone replacement therapy (HRT) to treat symptoms of menopause. Follow these instructions at home: Lifestyle  Do not use any products that contain nicotine or tobacco, such as cigarettes, e-cigarettes, and chewing tobacco. If you need help quitting, ask your health care provider.  Do not use street drugs.  Do not share needles.  Ask your health care provider for help if you need support or information about quitting drugs. Alcohol use  Do not drink alcohol if: ? Your health care provider tells you not to drink. ? You are pregnant, may be pregnant, or are planning to become pregnant.  If you drink alcohol: ? Limit how much you use to 0-1 drink a day. ? Limit intake if you are breastfeeding.  Be aware of how much alcohol is in your drink. In the [place] one drink equals one 12 oz bottle of beer (355 mL), one 5 oz glass of wine (148 mL), or one 1 oz glass of hard liquor (44 mL). General instructions  Schedule regular health, dental, and eye exams.  Stay current with your vaccines.  Tell your health care provider if: ? You often feel depressed. ? You have ever been abused or do not feel safe at home.  Summary  Adopting a healthy lifestyle and getting preventive care are important in promoting health and wellness.  Follow your health care provider's instructions about healthy diet, exercising, and getting tested or screened for diseases.  Follow your health care provider's instructions on monitoring your cholesterol and blood pressure. This information is not intended to replace advice given to you by your health care provider. Make sure you discuss any questions you have with your health care provider. Document Released: 04/29/2011 Document Revised: 10/07/2018 Document Reviewed: 10/07/2018 Elsevier Patient Education  2020 [employer].

## 2019-06-02 NOTE — Progress Notes (Signed)
Subjective:    Patient ID: Vanessa Merritt, female    DOB: 11-18-1990, 28 y.o.   MRN: [id number]  HPI This is a 28 yo female who presents today for cpe. Has been doing well. 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 Has a boyfriend x 1 year.   Last CPE- 08/30/2015 Pap- never, will have today Tdap- 06/04/2011 Flu- annual Exercise- walks her dog  DUB- doing well on OCPs.   ADHD- Adderall XR 20 mg working well for her. No chest pain, no palpitations, no insomnia.   Past Medical History:  Diagnosis Date  . Allergic rhinitis   . Back pain   . Childhood asthma    Past Surgical History:  Procedure Laterality Date  . NO PAST SURGERIES     Family History  Problem Relation Age of Onset  . Hypothyroidism Mother   . Hyperlipidemia Mother   . Asthma Mother   . Allergic rhinitis Mother   . Hypertension Father   . Angioedema Neg Hx   . Eczema Neg Hx   . Immunodeficiency Neg Hx   . Urticaria Neg Hx    Social History   Tobacco Use  . Smoking status: Never Smoker  . Smokeless tobacco: Never Used  Substance Use Topics  . Alcohol use: Yes    Alcohol/week: 2.0 standard drinks    Types: 1 Glasses of wine, 1 Cans of beer per week    Comment: occasional ETOH  . Drug use: No   Depression screen [facility] 2/9 06/02/2019 05/18/2018  Decreased Interest 0 0  Down, Depressed, Hopeless 0 0  PHQ - 2 Score 0 0      Review of Systems  Constitutional: Negative.   HENT: Negative.   Eyes: Negative.   Respiratory: Negative.   Cardiovascular: Negative.   Gastrointestinal: Negative.   Endocrine: Negative.   Genitourinary: Negative.   Musculoskeletal: Negative.   Skin: Negative.   Allergic/Immunologic: Negative.   Neurological: Negative.   Hematological: Negative.   Psychiatric/Behavioral: Negative.        Objective:   Physical Exam Physical Exam  Constitutional: She is oriented to person, place, and time. She appears well-developed and well-nourished. No distress.  Obese.  HENT:  Head: Normocephalic and atraumatic.  Right Ear: External ear normal.  Left Ear: External ear normal.  Eyes: Conjunctivae are normal.   Neck: Normal range of motion. Neck supple. No JVD present. No thyromegaly present.  Cardiovascular: Normal rate, Pulmonary/Chest: Effort normal. Right breast exhibits no inverted nipple, no mass, no nipple discharge, no skin change and no tenderness. Left breast exhibits no inverted nipple, no mass, no nipple discharge, no skin change and no tenderness. Breasts are symmetrical.  Abdominal: Soft. Bowel sounds are normal. She exhibits no distension and no mass. There is no tenderness. There is no rebound and no guarding.  Genitourinary: Vagina normal. Pelvic exam was performed with patient supine. There is no rash, tenderness, lesion or injury on the right labia. There is no rash, tenderness, lesion or injury on the left labia. Cervix exhibits no motion tenderness and no discharge. White vaginal discharge found.  Musculoskeletal: Normal range of motion. She exhibits no edema or tenderness.  Lymphadenopathy:    She has no cervical adenopathy.  Neurological: She is alert and oriented to person, place, and time. She has normal reflexes.  Skin: Skin is warm and dry. She is not diaphoretic.  Psychiatric: She has a normal mood and affect. Her behavior is normal. Judgment and thought  content normal.  Vitals reviewed.   BP 124/82 (BP Location: Left Arm, Patient Position: Sitting, Cuff Size: Normal)   Pulse 77   Temp 99.4 F (37.4 C) (Temporal)   Ht 5\' 7"  (1.702 m)   Wt 248 lb (112.5 kg)   LMP 05/10/2019 (Exact Date)   SpO2 100%   BMI 38.84 kg/m  Wt Readings from Last 3 Encounters:  06/02/19 248 lb (112.5 kg)  12/14/18 249 lb 12.8 oz (113.3 kg)  11/30/18 249 lb (112.9 kg)       Assessment & Plan:  1. Routine general medical examination at a health care facility - reviewed lab results - Discussed and encouraged healthy lifestyle choices-  adequate sleep, regular exercise, stress management and healthy food choices.   2. Dysfunctional uterine bleeding - well controlled on OCPs - Norethindrone-Ethinyl Estradiol-Fe Biphas (LO LOESTRIN FE) 1 MG-10 MCG / 10 MCG tablet; Take 1 tablet by mouth daily.  Dispense: 3 Package; Refill: 4  3. Attention deficit hyperactivity disorder (ADHD), predominantly inattentive type - she will let me know when she needs refill in 3 months, follow up in 6 months - amphetamine-dextroamphetamine (ADDERALL XR) 20 MG 24 hr capsule; Take 1 capsule (20 mg total) by mouth every morning.  Dispense: 30 capsule; Refill: 0 - amphetamine-dextroamphetamine (ADDERALL XR) 20 MG 24 hr capsule; Take 1 capsule (20 mg total) by mouth every morning.  Dispense: 30 capsule; Refill: 0 - amphetamine-dextroamphetamine (ADDERALL XR) 20 MG 24 hr capsule; Take 1 capsule (20 mg total) by mouth daily.  Dispense: 30 capsule; Refill: 0  4. Screening for cervical cancer - Cytology - PAP([place])   Olean Reeeborah Zenola Dezarn, FNP-BC  [facility]  06/02/2019 12:42 PM

## 2019-06-07 ENCOUNTER — Other Ambulatory Visit: Payer: Self-pay

## 2019-06-07 LAB — CYTOLOGY - PAP
Chlamydia: NEGATIVE
Diagnosis: NEGATIVE
HPV: NOT DETECTED
Neisseria Gonorrhea: NEGATIVE
Trichomonas: NEGATIVE

## 2019-06-09 ENCOUNTER — Encounter: Payer: Self-pay | Admitting: Family Medicine

## 2019-06-14 ENCOUNTER — Other Ambulatory Visit: Payer: Self-pay

## 2019-08-04 ENCOUNTER — Other Ambulatory Visit: Payer: Self-pay

## 2019-08-04 ENCOUNTER — Ambulatory Visit (INDEPENDENT_AMBULATORY_CARE_PROVIDER_SITE_OTHER): Payer: BC Managed Care – PPO | Admitting: Family Medicine

## 2019-08-04 ENCOUNTER — Encounter: Payer: Self-pay | Admitting: Family Medicine

## 2019-08-04 VITALS — Temp 97.2°F | Ht 67.0 in | Wt 250.0 lb

## 2019-08-04 DIAGNOSIS — J3089 Other allergic rhinitis: Secondary | ICD-10-CM

## 2019-08-04 DIAGNOSIS — J029 Acute pharyngitis, unspecified: Secondary | ICD-10-CM

## 2019-08-04 NOTE — Progress Notes (Signed)
Virtual Visit via Video Note  I connected with Vanessa Merritt on 08/04/19 at  9:00 AM EDT by a video enabled telemedicine application and verified that I am speaking with the correct person using two identifiers.  Location: Patient: at work Provider: [place]. NP student Kathe Becton present for call   I discussed the limitations of evaluation and management by telemedicine and the availability of in person appointments. The patient expressed understanding and agreed to proceed.  History of Present Illness: Chief Complaint  Patient presents with  . Sore Throat    Pt c/o inflamed tonsils and lymph nodes. Pt states that her throat is sore. Having increased allergies x 2 days, runny nose, PND and ear pain. Pt states that since she has been upright this morning her sore throat has improved -- worse by PND while laying flat??  Denies fever.   This is a 28 year old female who requests virtual visit today above complaints.  She started to have increased allergy symptoms yesterday with increased nasal drainage and postnasal drainage.  She has been off her Flonase for 4 days.  She noticed some "inflammation," of her throat and some fullness of her left lymph nodes.  She has a little bit of bilateral ear pressure.  She does not have cough or fever.  She has been using Chloraseptic type throat spray with some relief of symptoms.  She notes that when she got up and began moving around more this morning that her tonsils felt less inflamed.  He denies cough. She has history of strep throat in the past and reports that this is always accompanied by high fever and vomiting, neither of which she has today. Past Medical History:  Diagnosis Date  . Allergic rhinitis   . Back pain   . Childhood asthma    Past Surgical History:  Procedure Laterality Date  . NO PAST SURGERIES     Family History  Problem Relation Age of Onset  . Hypothyroidism Mother   . Hyperlipidemia Mother   . Asthma Mother   .  Allergic rhinitis Mother   . Hypertension Father   . Angioedema Neg Hx   . Eczema Neg Hx   . Immunodeficiency Neg Hx   . Urticaria Neg Hx    Social History   Tobacco Use  . Smoking status: Never Smoker  . Smokeless tobacco: Never Used  Substance Use Topics  . Alcohol use: Yes    Alcohol/week: 2.0 standard drinks    Types: 1 Glasses of wine, 1 Cans of beer per week    Comment: occasional ETOH  . Drug use: No      Observations/Objective: The patient is alert and answers questions appropriately.  She is normally conversive without shortness of breath, audible wheeze or witnessed cough.  Visible skin is unremarkable.  Mood and affect are appropriate. Temp (!) 97.2 F (36.2 C) (Temporal)   Ht 5\' 7"  (1.702 m)   Wt 250 lb (113.4 kg)   BMI 39.16 kg/m   Assessment and Plan: 1. Allergic rhinitis -She plans to resume her Flonase today, continue cetirizine and montelukast  2. Sore throat -Suspect related to #1.  She was instructed on over-the-counter ibuprofen use for feelings of inflammation and pain. -She was instructed to notify the office if she develops her typical symptoms of strep throat, fever or vomiting or if symptoms have not improved in 5 to 7 days.   Clarene Reamer, FNP-BC  [facility] at [facility], [place]  [facility]  08/04/2019 9:31 AM   Follow Up Instructions:    I discussed the assessment and treatment plan with the patient. The patient was provided an opportunity to ask questions and all were answered. The patient agreed with the plan and demonstrated an understanding of the instructions.   The patient was advised to call back or seek an in-person evaluation if the symptoms worsen or if the condition fails to improve as anticipated.  Emi Belfast, FNP

## 2019-10-04 ENCOUNTER — Other Ambulatory Visit: Payer: Self-pay

## 2019-10-04 DIAGNOSIS — F900 Attention-deficit hyperactivity disorder, predominantly inattentive type: Secondary | ICD-10-CM

## 2019-10-04 MED ORDER — AMPHETAMINE-DEXTROAMPHET ER 20 MG PO CP24: 20.0000 mg | ORAL_CAPSULE | ORAL | 0 refills | Status: DC

## 2019-10-04 NOTE — Telephone Encounter (Signed)
Requesting refill on Adderall XR 20 mg tablets. This was last refilled in 06/02/19 for a 3 month supply.  Patient was last seen for CPE on 06/02/19, and has an upcoming appt on 12/13/19. Is this ok to refill?

## 2019-10-04 NOTE — Telephone Encounter (Signed)
No suspicious activity noted on PMP aware site, refill sent to pharmacy. 

## 2019-11-12 ENCOUNTER — Other Ambulatory Visit: Payer: Self-pay | Admitting: Family Medicine

## 2019-11-12 ENCOUNTER — Telehealth: Payer: Self-pay | Admitting: Family Medicine

## 2019-11-12 DIAGNOSIS — F900 Attention-deficit hyperactivity disorder, predominantly inattentive type: Secondary | ICD-10-CM

## 2019-11-12 MED ORDER — AMPHETAMINE-DEXTROAMPHET ER 20 MG PO CP24: 20.0000 mg | ORAL_CAPSULE | ORAL | 0 refills | Status: DC

## 2019-11-12 NOTE — Telephone Encounter (Signed)
Please call patient let her know that I have sent into 30-day prescriptions for her Adderall XR.  Because it is a controlled substance, you can only get a 30-day supply at a time.  I look forward to seeing her in February.

## 2019-11-12 NOTE — Telephone Encounter (Signed)
Unable to LMOVM, VM full ?

## 2019-11-12 NOTE — Telephone Encounter (Signed)
Please advise 

## 2019-11-12 NOTE — Telephone Encounter (Signed)
Patient called. Her last rx for Adderrall was sent in as a 30 day rx.  Patient's requesting a 2 month rx to [facility]. Her follow up appointment is in February.  Patient took her last pill today.

## 2019-11-16 NOTE — Telephone Encounter (Signed)
Called both numbers but could not leave a message on either phone numbers

## 2019-11-17 NOTE — Telephone Encounter (Signed)
Spoke with pt and informed her of Debbie's message. Pt states she has already picked up rx. Nothing further is needed

## 2019-12-13 ENCOUNTER — Other Ambulatory Visit: Payer: Self-pay

## 2019-12-13 ENCOUNTER — Ambulatory Visit: Payer: BC Managed Care – PPO | Admitting: Family Medicine

## 2019-12-13 ENCOUNTER — Encounter: Payer: Self-pay | Admitting: Family Medicine

## 2019-12-13 DIAGNOSIS — J4520 Mild intermittent asthma, uncomplicated: Secondary | ICD-10-CM | POA: Diagnosis not present

## 2019-12-13 DIAGNOSIS — J3089 Other allergic rhinitis: Secondary | ICD-10-CM

## 2019-12-13 DIAGNOSIS — F900 Attention-deficit hyperactivity disorder, predominantly inattentive type: Secondary | ICD-10-CM | POA: Diagnosis not present

## 2019-12-13 MED ORDER — PROAIR RESPICLICK 108 (90 BASE) MCG/ACT IN AEPB: 2.0000 | INHALATION_SPRAY | RESPIRATORY_TRACT | 1 refills | Status: DC | PRN

## 2019-12-13 MED ORDER — AMPHETAMINE-DEXTROAMPHET ER 20 MG PO CP24: 20.0000 mg | ORAL_CAPSULE | ORAL | 0 refills | Status: DC

## 2019-12-13 MED ORDER — FLUTICASONE PROPIONATE 50 MCG/ACT NA SUSP: 1.0000 | Freq: Every day | NASAL | 5 refills | Status: DC

## 2019-12-13 MED ORDER — MONTELUKAST SODIUM 10 MG PO TABS: 10.0000 mg | ORAL_TABLET | Freq: Every day | ORAL | 5 refills | Status: DC

## 2019-12-13 NOTE — Progress Notes (Signed)
   Subjective:    Patient ID: Vanessa Merritt, female    DOB: Mar 01, 1991, 29 y.o.   MRN: [id number]  HPI Chief Complaint  Patient presents with  . Follow-up    NO new concerns   This is a 29 yo female who presents today for follow up of ADHD. 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 Takes her meds for work only.   Asthma and allergies well controlled- requests refill of singulair. Had a flare in November, usually worse in fall. Requests refill of albuterol and fluticasone nasal spray.   Abnormal uterine bleeding- doing well on OCPs.    Review of Systems Per HPI    Objective:   Physical Exam Vitals reviewed.  Constitutional:      General: She is not in acute distress.    Appearance: Normal appearance. She is obese. She is not ill-appearing, toxic-appearing or diaphoretic.  HENT:     Head: Normocephalic and atraumatic.  Cardiovascular:     Rate and Rhythm: Normal rate.  Pulmonary:     Effort: Pulmonary effort is normal.  Neurological:     Mental Status: She is alert and oriented to person, place, and time.  Psychiatric:        Mood and Affect: Mood normal.        Behavior: Behavior normal.        Thought Content: Thought content normal.        Judgment: Judgment normal.       BP 132/82 (BP Location: Left Arm, Patient Position: Sitting, Cuff Size: Large)   Pulse 98   Temp 98 F (36.7 C) (Temporal)   Ht 5\' 7"  (1.702 m)   Wt 255 lb 12.8 oz (116 kg)   SpO2 99%   BMI 40.06 kg/m  Wt Readings from Last 3 Encounters:  12/13/19 255 lb 12.8 oz (116 kg)  08/04/19 250 lb (113.4 kg)  06/02/19 248 lb (112.5 kg)        Assessment & Plan:

## 2019-12-13 NOTE — Patient Instructions (Addendum)
For your albuterol inhaler-  Ask how much it is before they ring it up If it is expensive, ask if there is a less expensive alternative? See if there is a goodrx.com coupon  Follow up in 6 months for your complete physical

## 2020-05-30 ENCOUNTER — Other Ambulatory Visit: Payer: Self-pay | Admitting: Family Medicine

## 2020-05-30 DIAGNOSIS — J3089 Other allergic rhinitis: Secondary | ICD-10-CM

## 2020-05-30 DIAGNOSIS — J4520 Mild intermittent asthma, uncomplicated: Secondary | ICD-10-CM

## 2020-06-01 ENCOUNTER — Other Ambulatory Visit: Payer: Self-pay | Admitting: Primary Care

## 2020-06-01 ENCOUNTER — Other Ambulatory Visit: Payer: Self-pay

## 2020-06-01 ENCOUNTER — Other Ambulatory Visit (INDEPENDENT_AMBULATORY_CARE_PROVIDER_SITE_OTHER): Payer: BC Managed Care – PPO

## 2020-06-01 ENCOUNTER — Other Ambulatory Visit: Payer: Self-pay | Admitting: Family Medicine

## 2020-06-01 DIAGNOSIS — Z0000 Encounter for general adult medical examination without abnormal findings: Secondary | ICD-10-CM

## 2020-06-01 DIAGNOSIS — Z114 Encounter for screening for human immunodeficiency virus [HIV]: Secondary | ICD-10-CM

## 2020-06-01 DIAGNOSIS — Z1159 Encounter for screening for other viral diseases: Secondary | ICD-10-CM

## 2020-06-01 LAB — CBC
HCT: 36.8 % (ref 36.0–46.0)
Hemoglobin: 12.4 g/dL (ref 12.0–15.0)
MCHC: 33.6 g/dL (ref 30.0–36.0)
MCV: 85.2 fl (ref 78.0–100.0)
Platelets: 345 10*3/uL (ref 150.0–400.0)
RBC: 4.32 Mil/uL (ref 3.87–5.11)
RDW: 13.2 % (ref 11.5–15.5)
WBC: 9.4 10*3/uL (ref 4.0–10.5)

## 2020-06-01 LAB — COMPREHENSIVE METABOLIC PANEL
ALT: 13 U/L (ref 0–35)
AST: 12 U/L (ref 0–37)
Albumin: 4.1 g/dL (ref 3.5–5.2)
Alkaline Phosphatase: 75 U/L (ref 39–117)
BUN: 13 mg/dL (ref 6–23)
CO2: 29 mEq/L (ref 19–32)
Calcium: 9.1 mg/dL (ref 8.4–10.5)
Chloride: 102 mEq/L (ref 96–112)
Creatinine, Ser: 0.65 mg/dL (ref 0.40–1.20)
GFR: 109.34 mL/min (ref >60.00)
Glucose, Bld: 83 mg/dL (ref 70–99)
Potassium: 3.8 mEq/L (ref 3.5–5.1)
Sodium: 136 mEq/L (ref 135–145)
Total Bilirubin: 0.8 mg/dL (ref 0.2–1.2)
Total Protein: 6.8 g/dL (ref 6.0–8.3)

## 2020-06-01 LAB — LIPID PANEL
Cholesterol: 182 mg/dL (ref 0–200)
HDL: 38.5 mg/dL — ABNORMAL LOW (ref >39.00)
LDL Cholesterol: 128 mg/dL — ABNORMAL HIGH (ref 0–99)
NonHDL: 143.89
Total CHOL/HDL Ratio: 5
Triglycerides: 77 mg/dL (ref 0.0–149.0)
VLDL: 15.4 mg/dL (ref 0.0–40.0)

## 2020-06-05 ENCOUNTER — Encounter: Payer: Self-pay | Admitting: Family Medicine

## 2020-06-05 ENCOUNTER — Ambulatory Visit (INDEPENDENT_AMBULATORY_CARE_PROVIDER_SITE_OTHER): Payer: BC Managed Care – PPO | Admitting: Family Medicine

## 2020-06-05 ENCOUNTER — Other Ambulatory Visit: Payer: Self-pay

## 2020-06-05 VITALS — BP 118/84 | HR 84 | Temp 97.8°F | Ht 68.0 in | Wt 251.0 lb

## 2020-06-05 DIAGNOSIS — Z0000 Encounter for general adult medical examination without abnormal findings: Secondary | ICD-10-CM

## 2020-06-05 DIAGNOSIS — J3089 Other allergic rhinitis: Secondary | ICD-10-CM | POA: Diagnosis not present

## 2020-06-05 DIAGNOSIS — J4520 Mild intermittent asthma, uncomplicated: Secondary | ICD-10-CM

## 2020-06-05 DIAGNOSIS — N938 Other specified abnormal uterine and vaginal bleeding: Secondary | ICD-10-CM

## 2020-06-05 DIAGNOSIS — F900 Attention-deficit hyperactivity disorder, predominantly inattentive type: Secondary | ICD-10-CM

## 2020-06-05 MED ORDER — AMPHETAMINE-DEXTROAMPHET ER 20 MG PO CP24: 20.0000 mg | ORAL_CAPSULE | ORAL | 0 refills | Status: DC

## 2020-06-05 MED ORDER — NORETHIN-ETH ESTRAD-FE BIPHAS 1 MG-10 MCG / 10 MCG PO TABS: 1.0000 | ORAL_TABLET | Freq: Every day | ORAL | 4 refills | Status: DC

## 2020-06-05 MED ORDER — MONTELUKAST SODIUM 10 MG PO TABS: ORAL_TABLET | ORAL | 3 refills | Status: DC

## 2020-06-05 NOTE — Progress Notes (Signed)
Subjective:    Patient ID: Vanessa Merritt, female    DOB: Feb 17, 1991, 29 y.o.   MRN: [id number]  HPI Chief Complaint  Patient presents with  . Annual Exam    last pap--last year   This is a 29 yo female who presents today for annual exam.   Last CPE- 06/02/2019 Pap-06/02/2019 Tdap- 06/04/2011 Flu- annual Covid-  Hasn't had yet Eye- overdue Dental- overdue Exercise- walking with dog Sexually active, no new partners, declines STD testing   ADHD-doing well on current medication.  Denies side effects.  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  Dysfunctional uterine bleeding-doing well on OCPs with regular manageable.  Menses.  Review of Systems  Constitutional: Negative.   HENT: Negative.   Eyes: Negative.   Respiratory: Negative.   Cardiovascular: Negative.   Gastrointestinal: Negative.   Endocrine: Negative.   Genitourinary: Negative.   Musculoskeletal: Negative.   Skin: Negative.   Allergic/Immunologic: Negative.   Neurological: Negative.   Hematological: Negative.   Psychiatric/Behavioral: Negative.        Objective:   Physical Exam Physical Exam  Constitutional: She is oriented to person, place, and time. She appears well-developed and well-nourished. No distress.  HENT:  Head: Normocephalic and atraumatic.  Right Ear: External ear normal.  Left Ear: External ear normal.  Nose: Nose normal.  Mouth/Throat: Oropharynx is clear and moist. No oropharyngeal exudate.  Eyes: Conjunctivae are normal.  Neck: Normal range of motion. Neck supple. No JVD present. No thyromegaly present.  Cardiovascular: Normal rate, regular rhythm, normal heart sounds and intact distal pulses.   Pulmonary/Chest: Effort normal and breath sounds normal. Right breast exhibits no inverted nipple, no mass, no nipple discharge, no skin change and no tenderness. Left breast exhibits no inverted nipple, no mass, no  nipple discharge, no skin change and no tenderness. Breasts are symmetrical.  Abdominal: Soft. Bowel sounds are normal. She exhibits no distension and no mass. There is no tenderness. There is no rebound and no guarding.  Musculoskeletal: Normal range of motion. She exhibits no edema or tenderness.  Lymphadenopathy:    She has no cervical adenopathy.  Neurological: She is alert and oriented to person, place, and time.  Skin: Skin is warm and dry. She is not diaphoretic.  Psychiatric: She has a normal mood and affect. Her behavior is normal. Judgment and thought content normal.  Vitals reviewed.     BP 118/84   Pulse 84   Temp 97.8 F (36.6 C)   Ht 5\' 8"  (1.727 m)   Wt 251 lb (113.9 kg)   LMP 05/20/2020   SpO2 98%   BMI 38.16 kg/m  Wt Readings from Last 3 Encounters:  06/05/20 251 lb (113.9 kg)  12/13/19 255 lb 12.8 oz (116 kg)  08/04/19 250 lb (113.4 kg)   Depression screen [facility] 2/9 06/05/2020 06/02/2019 05/18/2018  Decreased Interest 0 0 0  Down, Depressed, Hopeless 0 0 0  PHQ - 2 Score 0 0 0       Assessment & Plan:  1. Annual physical exam - Discussed and encouraged healthy lifestyle choices- adequate sleep, regular exercise, stress management and healthy food choices.    2. Attention deficit hyperactivity disorder (ADHD), predominantly inattentive type - amphetamine-dextroamphetamine (ADDERALL XR) 20 MG 24 hr capsule; Take 1 capsule (20 mg total) by mouth every morning.  Dispense: 30 capsule; Refill: 0 - amphetamine-dextroamphetamine (ADDERALL XR) 20 MG 24 hr capsule;  Take 1 capsule (20 mg total) by mouth every morning.  Dispense: 30 capsule; Refill: 0 - amphetamine-dextroamphetamine (ADDERALL XR) 20 MG 24 hr capsule; Take 1 capsule (20 mg total) by mouth every morning.  Dispense: 30 capsule; Refill: 0 -Can call for refills in 3 months, follow-up in the office or virtual visit in 6 months  3. Dysfunctional uterine bleeding - Norethindrone-Ethinyl Estradiol-Fe Biphas (LO  LOESTRIN FE) 1 MG-10 MCG / 10 MCG tablet; Take 1 tablet by mouth daily.  Dispense: 84 tablet; Refill: 4  4. Allergic rhinitis - montelukast (SINGULAIR) 10 MG tablet; TAKE 1 TABLET BY MOUTH EVERYDAY AT BEDTIME  Dispense: 90 tablet; Refill: 3  5. Mild intermittent asthma, unspecified whether complicated -Well-controlled with rare use of albuterol - montelukast (SINGULAIR) 10 MG tablet; TAKE 1 TABLET BY MOUTH EVERYDAY AT BEDTIME  Dispense: 90 tablet; Refill: 3  This visit occurred during the SARS-CoV-2 public health emergency.  Safety protocols were in place, including screening questions prior to the visit, additional usage of staff PPE, and extensive cleaning of exam room while observing appropriate contact time as indicated for disinfecting solutions.    Olean Ree, FNP-BC  [facility], [facility]  06/06/2020 8:22 AM

## 2020-06-05 NOTE — Patient Instructions (Addendum)
Good to see you today  Increase your nut intake- almonds, walnuts, cashews  Use olive, avocado, coconut oils, butter/ ghee  Send mychart message for refill of Adderall in 3-4 months and follow up in office or virtually in 6 months  Increase your water intake, keep walking

## 2020-12-11 ENCOUNTER — Other Ambulatory Visit: Payer: Self-pay | Admitting: Family Medicine

## 2020-12-11 DIAGNOSIS — F900 Attention-deficit hyperactivity disorder, predominantly inattentive type: Secondary | ICD-10-CM

## 2020-12-11 MED ORDER — AMPHETAMINE-DEXTROAMPHET ER 20 MG PO CP24: 20.0000 mg | ORAL_CAPSULE | ORAL | 0 refills | Status: DC

## 2020-12-11 NOTE — Telephone Encounter (Signed)
Last OV: 06/05/20 Last refill: 08/05/20 #30 w/ 1 TOC: 01/22/21

## 2020-12-11 NOTE — Telephone Encounter (Signed)
Pt called in wanted to know about refill for her adderall xr 20mg  she is et to have a toc next month with Dr. 

## 2020-12-13 ENCOUNTER — Ambulatory Visit: Payer: BC Managed Care – PPO | Admitting: Family Medicine

## 2020-12-13 NOTE — Telephone Encounter (Signed)
[facility] approve [facility] [place] [facility] [place]. I will call [place] and cancel previous refill.

## 2020-12-13 NOTE — Addendum Note (Signed)
Addended by: Erby Pian on: 12/13/2020 04:36 PM   Modules accepted: Orders

## 2020-12-13 NOTE — Telephone Encounter (Signed)
Pt called in wanted to know if she can [facility] [place] [id number]  [phone] [facility] [place]

## 2020-12-14 MED ORDER — AMPHETAMINE-DEXTROAMPHET ER 20 MG PO CP24: 20.0000 mg | ORAL_CAPSULE | ORAL | 0 refills | Status: DC

## 2020-12-14 NOTE — Telephone Encounter (Signed)
I am happy to refill for patient to preferred pharmacy, but she also needs an updated UDS, I don't see one on file. How about controlled substance contract?  I recommend she have these done prior to her next refill.  No suspicious activity noted on PMP aware web site. Refill sent to pharmacy.

## 2020-12-14 NOTE — Addendum Note (Signed)
Addended by: Doreene Nest on: 12/14/2020 07:18 AM   Modules accepted: Orders

## 2021-01-22 ENCOUNTER — Encounter: Payer: Self-pay | Admitting: Family Medicine

## 2021-01-22 ENCOUNTER — Other Ambulatory Visit: Payer: Self-pay | Admitting: Family Medicine

## 2021-01-22 ENCOUNTER — Ambulatory Visit: Payer: BC Managed Care – PPO | Admitting: Family Medicine

## 2021-01-22 ENCOUNTER — Other Ambulatory Visit: Payer: Self-pay

## 2021-01-22 VITALS — BP 122/86 | HR 77 | Temp 98.3°F | Ht 68.0 in | Wt 252.5 lb

## 2021-01-22 DIAGNOSIS — J4520 Mild intermittent asthma, uncomplicated: Secondary | ICD-10-CM

## 2021-01-22 DIAGNOSIS — J3089 Other allergic rhinitis: Secondary | ICD-10-CM

## 2021-01-22 DIAGNOSIS — N938 Other specified abnormal uterine and vaginal bleeding: Secondary | ICD-10-CM | POA: Diagnosis not present

## 2021-01-22 DIAGNOSIS — F900 Attention-deficit hyperactivity disorder, predominantly inattentive type: Secondary | ICD-10-CM

## 2021-01-22 MED ORDER — AMPHETAMINE-DEXTROAMPHET ER 20 MG PO CP24: 20.0000 mg | ORAL_CAPSULE | ORAL | 0 refills | Status: DC

## 2021-01-22 MED ORDER — FLUTICASONE PROPIONATE 50 MCG/ACT NA SUSP: 1.0000 | Freq: Every day | NASAL | 5 refills | Status: DC

## 2021-01-22 MED ORDER — PROAIR RESPICLICK 108 (90 BASE) MCG/ACT IN AEPB: 2.0000 | INHALATION_SPRAY | RESPIRATORY_TRACT | 1 refills | Status: DC | PRN

## 2021-01-22 NOTE — Assessment & Plan Note (Signed)
Doing well. Cont singulair and flonase

## 2021-01-22 NOTE — Assessment & Plan Note (Signed)
Stable. Cont prn albuterol and singulair 10 mg daily

## 2021-01-22 NOTE — Patient Instructions (Signed)
Great to meet you!  Return in 3 months for ADHD medication

## 2021-01-22 NOTE — Assessment & Plan Note (Addendum)
Doing well on adderall 20 mg. Restarted 2 years ago - had this as child. UDS and contract today. Return 3 months

## 2021-01-22 NOTE — Assessment & Plan Note (Signed)
Resolved with OCPs.

## 2021-01-22 NOTE — Progress Notes (Signed)
Subjective:     Vanessa Merritt is a 30 y.o. female presenting for Transitions Of Care, Follow-up (6 month), and Medication Refill     HPI   #ADHD - adderall still working well - doing well - issues as a child but never on medication - started 2 years ago   Review of Systems   Social History   Tobacco Use  Smoking Status Never Smoker  Smokeless Tobacco Never Used        Objective:    BP Readings from Last 3 Encounters:  01/22/21 122/86  06/05/20 118/84  12/13/19 132/82   Wt Readings from Last 3 Encounters:  01/22/21 252 lb 8 oz (114.5 kg)  06/05/20 251 lb (113.9 kg)  12/13/19 255 lb 12.8 oz (116 kg)    BP 122/86   Pulse 77   Temp 98.3 F (36.8 C) (Temporal)   Ht 5\' 8"  (1.727 m)   Wt 252 lb 8 oz (114.5 kg)   LMP 01/09/2021 (Exact Date)   SpO2 99%   BMI 38.39 kg/m    Physical Exam Constitutional:      General: She is not in acute distress.    Appearance: She is well-developed. She is not diaphoretic.  HENT:     Right Ear: External ear normal.     Left Ear: External ear normal.     Nose: Nose normal.  Eyes:     Conjunctiva/sclera: Conjunctivae normal.  Cardiovascular:     Rate and Rhythm: Normal rate and regular rhythm.     Heart sounds: No murmur heard.   Pulmonary:     Effort: Pulmonary effort is normal. No respiratory distress.     Breath sounds: Normal breath sounds. No wheezing.  Musculoskeletal:     Cervical back: Neck supple.  Skin:    General: Skin is warm and dry.     Capillary Refill: Capillary refill takes less than 2 seconds.  Neurological:     Mental Status: She is alert. Mental status is at baseline.  Psychiatric:        Mood and Affect: Mood normal.        Behavior: Behavior normal.           Assessment & Plan:   Problem List Items Addressed This Visit      Respiratory   Allergic rhinitis    Doing well. Cont singulair and flonase      Relevant Medications   fluticasone (FLONASE) 50 MCG/ACT nasal spray    Mild intermittent asthma    Stable. Cont prn albuterol and singulair 10 mg daily      Relevant Medications   Albuterol Sulfate (PROAIR RESPICLICK) 108 (90 Base) MCG/ACT AEPB     Genitourinary   Dysfunctional uterine bleeding    Resolved with OCPs.         Other   Attention deficit hyperactivity disorder (ADHD), predominantly inattentive type - Primary    Doing well on adderall 20 mg. Restarted 2 years ago - had this as child. UDS and contract today. Return 3 months      Relevant Medications   amphetamine-dextroamphetamine (ADDERALL XR) 20 MG 24 hr capsule   amphetamine-dextroamphetamine (ADDERALL XR) 20 MG 24 hr capsule (Start on 03/24/2021)   amphetamine-dextroamphetamine (ADDERALL XR) 20 MG 24 hr capsule (Start on 02/22/2021)   Other Relevant Orders   DRUG MONITORING, PANEL 8 WITH CONFIRMATION, URINE       Return in about 3 months (around 04/24/2021).  04/26/2021, MD  This  visit occurred during the SARS-CoV-2 public health emergency.  Safety protocols were in place, including screening questions prior to the visit, additional usage of staff PPE, and extensive cleaning of exam room while observing appropriate contact time as indicated for disinfecting solutions.

## 2021-01-23 LAB — DM TEMPLATE

## 2021-01-23 LAB — DRUG MONITORING, PANEL 8 WITH CONFIRMATION, URINE
6 Acetylmorphine: NEGATIVE ng/mL (ref <10)
Alcohol Metabolites: NEGATIVE ng/mL
Amphetamines: NEGATIVE ng/mL (ref <500)
Benzodiazepines: NEGATIVE ng/mL (ref <100)
Buprenorphine, Urine: NEGATIVE ng/mL (ref <5)
Cocaine Metabolite: NEGATIVE ng/mL (ref <150)
Creatinine: 114 mg/dL
MDMA: NEGATIVE ng/mL (ref <500)
Marijuana Metabolite: NEGATIVE ng/mL (ref <20)
Opiates: NEGATIVE ng/mL (ref <100)
Oxidant: NEGATIVE ug/mL
Oxycodone: NEGATIVE ng/mL (ref <100)
pH: 7.4 (ref 4.5–9.0)

## 2021-04-18 ENCOUNTER — Ambulatory Visit: Payer: BC Managed Care – PPO | Admitting: Family Medicine

## 2021-04-18 ENCOUNTER — Encounter: Payer: Self-pay | Admitting: Family Medicine

## 2021-04-18 ENCOUNTER — Other Ambulatory Visit: Payer: Self-pay

## 2021-04-18 VITALS — BP 110/80 | HR 73 | Temp 98.3°F | Ht 68.0 in | Wt 243.8 lb

## 2021-04-18 DIAGNOSIS — E669 Obesity, unspecified: Secondary | ICD-10-CM | POA: Diagnosis not present

## 2021-04-18 DIAGNOSIS — F900 Attention-deficit hyperactivity disorder, predominantly inattentive type: Secondary | ICD-10-CM | POA: Diagnosis not present

## 2021-04-18 DIAGNOSIS — Z6837 Body mass index (BMI) 37.0-37.9, adult: Secondary | ICD-10-CM

## 2021-04-18 MED ORDER — AMPHETAMINE-DEXTROAMPHET ER 25 MG PO CP24: 25.0000 mg | ORAL_CAPSULE | ORAL | 0 refills | Status: DC

## 2021-04-18 NOTE — Assessment & Plan Note (Signed)
Pt notes some good days and some days where medication does not seem effective. Discussed dose increase to see if this improves consistency. Increase Adderall 20>25 mg daily. Check back in 3 weeks to update and if tolerating will send remaining prescriptions.

## 2021-04-18 NOTE — Progress Notes (Signed)
Subjective:     Vanessa Merritt is a 30 y.o. 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 - planning to get in married July 31 - some days it works great and other days it doesn't seem to not work as well   Review of Systems   Social History   Tobacco Use  Smoking Status Never  Smokeless Tobacco Never        Objective:    BP Readings from Last 3 Encounters:  04/18/21 110/80  01/22/21 122/86  06/05/20 118/84   Wt Readings from Last 3 Encounters:  04/18/21 243 lb 12 oz (110.6 kg)  01/22/21 252 lb 8 oz (114.5 kg)  06/05/20 251 lb (113.9 kg)    BP 110/80 (BP Location: Right Arm, Patient Position: Sitting, Cuff Size: Large)   Pulse 73   Temp 98.3 F (36.8 C) (Temporal)   Ht 5\' 8"  (1.727 m)   Wt 243 lb 12 oz (110.6 kg)   LMP 04/17/2021 (Exact Date)   SpO2 99%   BMI 37.06 kg/m    Physical Exam Constitutional:      General: She is not in acute distress.    Appearance: She is well-developed. She is not diaphoretic.  HENT:     Right Ear: External ear normal.     Left Ear: External ear normal.  Eyes:     Conjunctiva/sclera: Conjunctivae normal.  Cardiovascular:     Rate and Rhythm: Normal rate.  Pulmonary:     Effort: Pulmonary effort is normal.  Musculoskeletal:     Cervical back: Neck supple.  Skin:    General: Skin is warm and dry.     Capillary Refill: Capillary refill takes less than 2 seconds.  Neurological:     Mental Status: She is alert. Mental status is at baseline.  Psychiatric:        Mood and Affect: Mood normal.        Behavior: Behavior normal.   Adult ADHD Self Report Scale (most recent)     Adult ADHD Self-Report Scale (ASRS-v1.1) Symptom Checklist - 04/18/21 1052       Part A   1. How often do you have trouble wrapping up the final details of a project, once the challenging parts have been done? Sometimes  2. How often do you have difficulty getting things done in  order when you have to do a task that requires organization? Sometimes    3. How often do you have problems remembering appointments or obligations? Rarely  4. When you have a task that requires a lot of thought, how often do you avoid or delay getting started? Often    5. How often do you fidget or squirm with your hands or feet when you have to sit down for a long time? Rarely  6. How often do you feel overly active and compelled to do things, like you were driven by a motor? Never      Part B   7. How often do you make careless mistakes when you have to work on a boring or difficult project? Sometimes  8. How often do you have difficulty keeping your attention when you are doing boring or repetitive work? Sometimes    9. How often do you have difficulty concentrating on what people say to you, even when they are speaking to you directly? Sometimes  10. How often do you  misplace or have difficulty finding things at home or at work? Sometimes    11. How often are you distracted by activity or noise around you? Often  12. How often do you leave your seat in meetings or other situations in which you are expected to remain seated? Rarely    13. How often do you feel restless or fidgety? Never  14. How often do you have difficulty unwinding and relaxing when you have time to yourself? Sometimes    15. How often do you find yourself talking too much when you are in social situations? Sometimes  16. When you are in a conversation, how often do you find yourself finishing the sentences of the people you are talking to, before they can finish them themselves? Often    17. How often do you have difficulty waiting your turn in situations when turn taking is required? Rarely  18. How often do you interrupt others when they are busy? Sometimes                   Assessment & Plan:   Problem List Items Addressed This Visit       Other   Obesity, unspecified    Weight down 9 lbs. Encouraged continuing  to make healthy choices.        Relevant Medications   amphetamine-dextroamphetamine (ADDERALL XR) 25 MG 24 hr capsule   Attention deficit hyperactivity disorder (ADHD), predominantly inattentive type - Primary    Pt notes some good days and some days where medication does not seem effective. Discussed dose increase to see if this improves consistency. Increase Adderall 20>25 mg daily. Check back in 3 weeks to update and if tolerating will send remaining prescriptions.        Relevant Medications   amphetamine-dextroamphetamine (ADDERALL XR) 25 MG 24 hr capsule     Return in about 4 months (around 08/18/2021).  Lynnda Child, MD  This visit occurred during the SARS-CoV-2 public health emergency.  Safety protocols were in place, including screening questions prior to the visit, additional usage of staff PPE, and extensive cleaning of exam room while observing appropriate contact time as indicated for disinfecting solutions.

## 2021-04-18 NOTE — Assessment & Plan Note (Signed)
Weight down 9 lbs. Encouraged continuing to make healthy choices.

## 2021-04-18 NOTE — Patient Instructions (Addendum)
Watch for side effects - decreased appetite, poor sleep  Watch for more consistent good days

## 2021-04-19 ENCOUNTER — Telehealth: Payer: Self-pay

## 2021-04-19 NOTE — Telephone Encounter (Signed)
PA received for the following RX: ADDERALL XR) 25 MG 24 hr capsule   PA entered in to cover my meds. Waiting on a reply

## 2021-05-02 NOTE — Telephone Encounter (Signed)
PA approved by [facility] for Amphetamine-Dextroamphetamine ER 25 mg for the following time period:  04/19/21-04/19/2024

## 2021-06-04 ENCOUNTER — Telehealth: Payer: Self-pay | Admitting: Family Medicine

## 2021-06-04 DIAGNOSIS — F900 Attention-deficit hyperactivity disorder, predominantly inattentive type: Secondary | ICD-10-CM

## 2021-06-04 MED ORDER — AMPHETAMINE-DEXTROAMPHET ER 25 MG PO CP24
25.0000 mg | ORAL_CAPSULE | ORAL | 0 refills | Status: DC
Start: 2021-06-04 — End: 2021-06-04

## 2021-06-04 MED ORDER — AMPHETAMINE-DEXTROAMPHET ER 25 MG PO CP24: 25.0000 mg | ORAL_CAPSULE | ORAL | 0 refills | Status: DC

## 2021-06-04 NOTE — Telephone Encounter (Signed)
Glad dose is working well  Sent in refills to make it next appointment

## 2021-06-04 NOTE — Telephone Encounter (Signed)
  Encourage patient to contact the pharmacy for refills or they can request refills through [facility]  LAST APPOINTMENT DATE:  04/18/21  NEXT APPOINTMENT DATE: 08/13/21  MEDICATION: Adderall  Is the patient out of medication? Yes, took last pill this morning. States the 25 mg was good and worked for her  [facility] [place]  Let patient know to contact pharmacy at the end of the day to make sure medication is ready.  Please notify patient to allow 48-72 hours to process  CLINICAL FILLS OUT ALL BELOW:   LAST REFILL:  QTY:  REFILL DATE:    OTHER COMMENTS:    Okay for refill?  Please advise

## 2021-06-22 ENCOUNTER — Other Ambulatory Visit: Payer: Self-pay

## 2021-06-22 DIAGNOSIS — J3089 Other allergic rhinitis: Secondary | ICD-10-CM

## 2021-06-22 DIAGNOSIS — J4520 Mild intermittent asthma, uncomplicated: Secondary | ICD-10-CM

## 2021-06-22 MED ORDER — MONTELUKAST SODIUM 10 MG PO TABS: ORAL_TABLET | ORAL | 3 refills | Status: AC

## 2021-07-17 ENCOUNTER — Telehealth: Payer: Self-pay | Admitting: Family Medicine

## 2021-07-17 NOTE — Telephone Encounter (Signed)
Pt called stating she just found out she was pregnant and wanted to know was it still ok to take the montelukast (SINGULAIR) 10 MG tablet and amphetamine-dextroamphetamine (ADDERALL XR) 25 MG 24 hr capsule.

## 2021-07-17 NOTE — Telephone Encounter (Signed)
She should Stop Adderall  She can continue Singulair

## 2021-07-18 NOTE — Telephone Encounter (Signed)
Patient called and informed of her message from Dr. Selena Batten to stop her adderall and continue her singular. Patient asked if there was anything that was recommended to take in it's place. Please advise.

## 2021-07-18 NOTE — Telephone Encounter (Signed)
Tried calling patient, shje didn't answer. Her mailbox was full and could not LVM. Will try again later.

## 2021-07-19 ENCOUNTER — Other Ambulatory Visit: Payer: Self-pay | Admitting: Family Medicine

## 2021-07-19 DIAGNOSIS — J3089 Other allergic rhinitis: Secondary | ICD-10-CM

## 2021-07-19 NOTE — Telephone Encounter (Signed)
If she feels she needs something during pregnancy I would recommend a follow-up visit to discuss all risks/benefits.   Though she may also want to reach out to her OB/GYN

## 2021-07-19 NOTE — Telephone Encounter (Signed)
Is it ok to refill Flonase? Patient is pregnant per last phone note

## 2021-07-20 NOTE — Telephone Encounter (Signed)
Tried calling the patient she did not answer, tried to leave a VM but her's is full and unable to leave a VM will try again later.

## 2021-08-13 ENCOUNTER — Ambulatory Visit: Payer: BC Managed Care – PPO | Admitting: Family Medicine

## 2021-08-14 ENCOUNTER — Other Ambulatory Visit: Payer: Self-pay

## 2021-08-14 ENCOUNTER — Ambulatory Visit (INDEPENDENT_AMBULATORY_CARE_PROVIDER_SITE_OTHER): Payer: BC Managed Care – PPO | Admitting: Family Medicine

## 2021-08-14 VITALS — BP 120/84 | HR 76 | Temp 96.3°F | Ht 68.0 in | Wt 255.1 lb

## 2021-08-14 DIAGNOSIS — Z23 Encounter for immunization: Secondary | ICD-10-CM | POA: Diagnosis not present

## 2021-08-14 DIAGNOSIS — F900 Attention-deficit hyperactivity disorder, predominantly inattentive type: Secondary | ICD-10-CM | POA: Diagnosis not present

## 2021-08-14 DIAGNOSIS — Z3491 Encounter for supervision of normal pregnancy, unspecified, first trimester: Secondary | ICD-10-CM

## 2021-08-14 NOTE — Telephone Encounter (Signed)
Pt being seen by Dr. Selena Batten 08/14/21 to discuss

## 2021-08-14 NOTE — Progress Notes (Signed)
Subjective:     Vanessa Merritt is a 30 y.o. female presenting for Follow-up (ADHD)     HPI  #ADHD - is pregnant - has not been adderall for 1 month - it is harder to focus  - first trimester has been tired - is current [redacted] weeks along - hard not having medication - no longer has suppression of appetite - and with pregnancy has been craving atypical foods - walking with the dog a couple of days a week - has had some morning sickness - but not bad - worse is missing meals    Review of Systems   Social History   Tobacco Use  Smoking Status Never  Smokeless Tobacco Never        Objective:    BP Readings from Last 3 Encounters:  08/14/21 120/84  04/18/21 110/80  01/22/21 122/86   Wt Readings from Last 3 Encounters:  08/14/21 255 lb 2 oz (115.7 kg)  04/18/21 243 lb 12 oz (110.6 kg)  01/22/21 252 lb 8 oz (114.5 kg)    BP 120/84   Pulse 76   Temp (!) 96.3 F (35.7 C) (Temporal)   Ht 5\' 8"  (1.727 m)   Wt 255 lb 2 oz (115.7 kg)   SpO2 100%   BMI 38.79 kg/m    Physical Exam Constitutional:      General: She is not in acute distress.    Appearance: She is well-developed. She is not diaphoretic.  HENT:     Right Ear: External ear normal.     Left Ear: External ear normal.  Eyes:     Conjunctiva/sclera: Conjunctivae normal.  Cardiovascular:     Rate and Rhythm: Normal rate.  Pulmonary:     Effort: Pulmonary effort is normal.  Musculoskeletal:     Cervical back: Neck supple.  Skin:    General: Skin is warm and dry.     Capillary Refill: Capillary refill takes less than 2 seconds.  Neurological:     Mental Status: She is alert. Mental status is at baseline.  Psychiatric:        Mood and Affect: Mood normal.        Behavior: Behavior normal.          Assessment & Plan:   Problem List Items Addressed This Visit       Other   Attention deficit hyperactivity disorder (ADHD), predominantly inattentive type    Stopped adderall 1 month ago when  she found out she was pregnant. Is noticing difficulty with energy and focus level. Long discussion regarding risks of ADHD treatment in pregnancy. No clear safe alternative (methylephenidate high risk, strattera also to be avoided, wellbutrin is Category C). Adderall XR is category C with low but possible risk for birth defects as seen in animals. Then increased risk for preterm, low birth weight, pre-eclampsia, and infant withdrawal. Discussed avoiding completely during the first trimester. But if symptoms severe and persisting if she is aware of the risks could consider treating. She will return in 6-8 weeks if symptoms are persisting and wanting treatment. Offered therapy as alternative - but she declined as she is aware of strategies.       First trimester pregnancy    Discussed no adderall during 1st trimester. But also discussed considering treating safer options - for example - insomnia to help with focus. Also discussed her symptoms may just improve in 2nd trimester. She will return if symptoms persisting into second trimester.  Other Visit Diagnoses     Need for influenza vaccination    -  Primary   Relevant Orders   Flu Vaccine QUAD 28mo+IM (Fluarix, Fluzone & Alfiuria Quad PF) (Completed)      I spent >25 minutes with pt , obtaining history, examining, reviewing chart, documenting encounter and discussing the above plan of care.   Return in about 4 months (around 12/15/2021) for if needed.  Lynnda Child, MD  This visit occurred during the SARS-CoV-2 public health emergency.  Safety protocols were in place, including screening questions prior to the visit, additional usage of staff PPE, and extensive cleaning of exam room while observing appropriate contact time as indicated for disinfecting solutions.

## 2021-08-14 NOTE — Assessment & Plan Note (Signed)
Stopped adderall 1 month ago when she found out she was pregnant. Is noticing difficulty with energy and focus level. Long discussion regarding risks of ADHD treatment in pregnancy. No clear safe alternative (methylephenidate high risk, strattera also to be avoided, wellbutrin is Category C). Adderall XR is category C with low but possible risk for birth defects as seen in animals. Then increased risk for preterm, low birth weight, pre-eclampsia, and infant withdrawal. Discussed avoiding completely during the first trimester. But if symptoms severe and persisting if she is aware of the risks could consider treating. She will return in 6-8 weeks if symptoms are persisting and wanting treatment. Offered therapy as alternative - but she declined as she is aware of strategies.

## 2021-08-14 NOTE — Assessment & Plan Note (Signed)
Discussed no adderall during 1st trimester. But also discussed considering treating safer options - for example - insomnia to help with focus. Also discussed her symptoms may just improve in 2nd trimester. She will return if symptoms persisting into second trimester.

## 2021-08-14 NOTE — Patient Instructions (Addendum)
ADHD in pregnancy - Would want to wait until the end of the first Trimester - Could consider during second - but you may want to see how you feel   If Adderall -- would probably want to consider stopping at 36 weeks to avoid potential infant withdrawals  I will check with psych to see about alternatives for safety

## 2021-08-21 LAB — OB RESULTS CONSOLE ABO/RH: RH Type: POSITIVE

## 2021-08-21 LAB — HEPATITIS C ANTIBODY: HCV Ab: NEGATIVE

## 2021-08-21 LAB — OB RESULTS CONSOLE HEPATITIS B SURFACE ANTIGEN: Hepatitis B Surface Ag: NEGATIVE

## 2021-08-21 LAB — OB RESULTS CONSOLE RPR: RPR: NONREACTIVE

## 2021-08-21 LAB — OB RESULTS CONSOLE HIV ANTIBODY (ROUTINE TESTING): HIV: NONREACTIVE

## 2021-08-21 LAB — OB RESULTS CONSOLE ANTIBODY SCREEN: Antibody Screen: NEGATIVE

## 2021-08-21 LAB — OB RESULTS CONSOLE RUBELLA ANTIBODY, IGM: Rubella: IMMUNE

## 2021-09-06 LAB — OB RESULTS CONSOLE GC/CHLAMYDIA
Chlamydia: NEGATIVE
Gonorrhea: NEGATIVE
Neisseria Gonorrhea: NEGATIVE

## 2021-09-24 ENCOUNTER — Other Ambulatory Visit: Payer: Self-pay

## 2021-09-24 ENCOUNTER — Encounter (HOSPITAL_COMMUNITY): Payer: Self-pay | Admitting: Obstetrics and Gynecology

## 2021-09-24 ENCOUNTER — Inpatient Hospital Stay (HOSPITAL_COMMUNITY)
Admission: AD | Admit: 2021-09-24 | Discharge: 2021-09-24 | Disposition: A | Discharge status: Home / Self Care | Payer: BC Managed Care – PPO | Attending: Obstetrics and Gynecology | Admitting: Obstetrics and Gynecology

## 2021-09-24 DIAGNOSIS — O26892 Other specified pregnancy related conditions, second trimester: Secondary | ICD-10-CM | POA: Insufficient documentation

## 2021-09-24 DIAGNOSIS — Z3A13 13 weeks gestation of pregnancy: Secondary | ICD-10-CM | POA: Diagnosis not present

## 2021-09-24 DIAGNOSIS — R0981 Nasal congestion: Secondary | ICD-10-CM | POA: Insufficient documentation

## 2021-09-24 DIAGNOSIS — J069 Acute upper respiratory infection, unspecified: Secondary | ICD-10-CM | POA: Insufficient documentation

## 2021-09-24 DIAGNOSIS — R197 Diarrhea, unspecified: Secondary | ICD-10-CM | POA: Insufficient documentation

## 2021-09-24 DIAGNOSIS — R059 Cough, unspecified: Secondary | ICD-10-CM | POA: Diagnosis not present

## 2021-09-24 LAB — URINALYSIS, ROUTINE W REFLEX MICROSCOPIC
Bilirubin Urine: NEGATIVE
Glucose, UA: NEGATIVE mg/dL
Hgb urine dipstick: NEGATIVE
Ketones, ur: NEGATIVE mg/dL
Leukocytes,Ua: NEGATIVE
Nitrite: NEGATIVE
Protein, ur: NEGATIVE mg/dL
Specific Gravity, Urine: 1.006 (ref 1.005–1.030)
pH: 7 (ref 5.0–8.0)

## 2021-09-24 NOTE — Discharge Instructions (Signed)

## 2021-09-24 NOTE — MAU Note (Signed)
Presents with c/o congestion, diarrhea x1 episode today (had diarrhea last week), congestion, and N/V last week.  Reports seen in urgent x2, initially diagnosed with strep throat based on symptoms although strep test was negative, prescribed antibiotics but told to stop antibiotics when second strep test was negative @ second visit to urgent care.  Pt states flu was negative last Wednesday. Denies pregnancy symptoms, just c/o respiratory symptoms. States sent by OB office to r/o dehydration.

## 2021-09-24 NOTE — MAU Provider Note (Signed)
History     CSN: [id number]  Arrival date and time: 09/24/21 1056  Chief Complaint  Patient presents with   Congestion   30 y.o. G1 @13 .2 wks sent from office to evaluate for dehydration. Reports diarrhea last week, once over the weekend, and once today. Denies emesis but has had nausea for the last few months. She is eating and drinking. Endorses nasal congestion and cough since last week. Was seen at [facility] twice and had negative Strep and Flu. 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 She has been using Flonase, was unsure what else she could take. Denies pregnancy complaints.    OB History     Gravida  1   Para      Term      Preterm      AB      Living         SAB      IAB      Ectopic      Multiple      Live Births              Past Medical History:  Diagnosis Date   Allergic rhinitis    Back pain    Childhood asthma     Past Surgical History:  Procedure Laterality Date   WISDOM TOOTH EXTRACTION      Family History  Problem Relation Age of Onset   Hypothyroidism Mother    Hyperlipidemia Mother    Asthma Mother    Allergic rhinitis Mother    Hypertension Father    Diabetes Paternal Grandmother    Diabetes Paternal Grandfather    Angioedema Neg Hx    Eczema Neg Hx    Immunodeficiency Neg Hx    Urticaria Neg Hx     Social History   Tobacco Use   Smoking status: Never   Smokeless tobacco: Never  Vaping Use   Vaping Use: Never used  Substance Use Topics   Alcohol use: Yes    Alcohol/week: 2.0 standard drinks    Types: 1 Glasses of wine, 1 Cans of beer per week    Comment: once a month   Drug use: No    Allergies: No Known Allergies  No medications prior to admission.   Review of Systems  Constitutional:  Negative for chills and fever.  HENT:  Positive for congestion, rhinorrhea and sinus pressure. Negative for sore throat.   Respiratory:  Positive for cough. Negative for shortness of breath.    Cardiovascular:  Negative for chest pain.  Gastrointestinal:  Negative for abdominal pain.  Genitourinary:  Negative for vaginal bleeding.  Physical Exam   Blood pressure 137/89, pulse 78, temperature 98.1 F (36.7 C), temperature source Oral, resp. rate 18, height 5\' 7"  (1.702 m), weight 117 kg, last menstrual period 04/17/2021, SpO2 99 %.  Physical Exam Vitals and nursing note reviewed.  Constitutional:      General: She is not in acute distress.    Appearance: Normal appearance.  HENT:     Head: Normocephalic and atraumatic.  Cardiovascular:     Rate and Rhythm: Normal rate and regular rhythm.     Heart sounds: Normal heart sounds.  Pulmonary:     Effort: Pulmonary effort is normal. No respiratory distress.     Breath sounds: Normal breath sounds. No stridor. No wheezing, rhonchi or rales.  Musculoskeletal:        General: Normal range of motion.     Cervical  back: Normal range of motion.  Skin:    General: Skin is warm and dry.  Neurological:     General: No focal deficit present.     Mental Status: She is alert and oriented to person, place, and time.  Psychiatric:        Mood and Affect: Mood normal.        Behavior: Behavior normal.  FHT 145  Results for orders placed or performed during the hospital encounter of 09/24/21 (from the past 24 hour(s))  Urinalysis, Routine w reflex microscopic Urine, Clean Catch     Status: Abnormal   Collection Time: 09/24/21 11:59 AM  Result Value Ref Range   Color, Urine YELLOW YELLOW   APPearance HAZY (A) CLEAR   Specific Gravity, Urine 1.006 1.005 - 1.030   pH 7.0 5.0 - 8.0   Glucose, UA NEGATIVE NEGATIVE mg/dL   Hgb urine dipstick NEGATIVE NEGATIVE   Bilirubin Urine NEGATIVE NEGATIVE   Ketones, ur NEGATIVE NEGATIVE mg/dL   Protein, ur NEGATIVE NEGATIVE mg/dL   Nitrite NEGATIVE NEGATIVE   Leukocytes,Ua NEGATIVE NEGATIVE   MAU Course  Procedures  MDM UA ordered and reviewed. No signs of dehydration. Recommend Mucinex and  other OTCs for symptom mngt. Stable for discharge home.   Assessment and Plan   1. [redacted] weeks gestation of pregnancy   2. Upper respiratory virus   3. Diarrhea, unspecified type    Discharge home Med list provided Return precautions Follow up with OB as scheduled  Allergies as of 09/24/2021   No Known Allergies      Medication List     STOP taking these medications    amphetamine-dextroamphetamine 25 MG 24 hr capsule Commonly known as: Adderall XR   Norethindrone-Ethinyl Estradiol-Fe Biphas 1 MG-10 MCG / 10 MCG tablet Commonly known as: LO LOESTRIN FE       TAKE these medications    albuterol 108 (90 Base) MCG/ACT inhaler Commonly known as: VENTOLIN HFA Inhale 2 puffs into the lungs every 6 (six) hours as needed for wheezing or shortness of breath.   diphenhydrAMINE 25 MG tablet Commonly known as: BENADRYL Take 25 mg by mouth every 6 (six) hours as needed.   fluticasone 50 MCG/ACT nasal spray Commonly known as: FLONASE SPRAY 1 SPRAY INTO BOTH NOSTRILS DAILY.   montelukast 10 MG tablet Commonly known as: SINGULAIR TAKE 1 TABLET BY MOUTH EVERYDAY AT BEDTIME        Donette Larry, CNM 09/24/2021, 1:11 PM

## 2021-11-20 ENCOUNTER — Other Ambulatory Visit: Payer: Self-pay | Admitting: Obstetrics and Gynecology

## 2021-11-20 DIAGNOSIS — Z363 Encounter for antenatal screening for malformations: Secondary | ICD-10-CM

## 2021-11-29 ENCOUNTER — Ambulatory Visit: Payer: BC Managed Care – PPO | Attending: Obstetrics and Gynecology | Admitting: Obstetrics and Gynecology

## 2021-11-29 ENCOUNTER — Ambulatory Visit: Payer: BC Managed Care – PPO | Admitting: *Deleted

## 2021-11-29 ENCOUNTER — Ambulatory Visit: Payer: BC Managed Care – PPO | Attending: Obstetrics and Gynecology

## 2021-11-29 ENCOUNTER — Other Ambulatory Visit: Payer: Self-pay

## 2021-11-29 VITALS — BP 126/79 | HR 89

## 2021-11-29 DIAGNOSIS — O43192 Other malformation of placenta, second trimester: Secondary | ICD-10-CM | POA: Diagnosis not present

## 2021-11-29 DIAGNOSIS — Z3A22 22 weeks gestation of pregnancy: Secondary | ICD-10-CM

## 2021-11-29 DIAGNOSIS — Z363 Encounter for antenatal screening for malformations: Secondary | ICD-10-CM | POA: Diagnosis present

## 2021-11-29 DIAGNOSIS — O99212 Obesity complicating pregnancy, second trimester: Secondary | ICD-10-CM | POA: Diagnosis not present

## 2021-11-29 DIAGNOSIS — O283 Abnormal ultrasonic finding on antenatal screening of mother: Secondary | ICD-10-CM

## 2021-11-29 DIAGNOSIS — Z362 Encounter for other antenatal screening follow-up: Secondary | ICD-10-CM | POA: Insufficient documentation

## 2021-11-30 ENCOUNTER — Other Ambulatory Visit: Payer: Self-pay | Admitting: *Deleted

## 2021-11-30 DIAGNOSIS — Z362 Encounter for other antenatal screening follow-up: Secondary | ICD-10-CM

## 2021-11-30 DIAGNOSIS — Z6841 Body Mass Index (BMI) 40.0 and over, adult: Secondary | ICD-10-CM

## 2021-11-30 NOTE — Progress Notes (Signed)
Maternal-Fetal Medicine   Name: Vanessa Merritt DOB: Apr 11, 1991 MRN: [id number] Referring Provider: Eyvonne Mechanic, MD  I had the pleasure of seeing [facility] Merr[facility] today at [facility]. She is G1 P0 at 22w 5d gestation and is here for a second-opinion ultrasound. At your office ultrasound abnormal findings including marginal cord insertion and abnormally-shaped head were seen. Patient reports she had cell free fetal DNA screening that showed low risk for fetal aneuploidies.  We do not have the results with [place] here.  Past medical history: Mild intermittent asthma.  No history of diabetes or hypertension or any chronic medical conditions.  Patient has been taking Adderall for attention deficit disorder and she had discontinued the medication at the onset of pregnancy. Past surgical history: Nil of note. Medications: Prenatal vitamins, albuterol, Singulair as needed. Allergies: No known drug allergies. Social history: Denies tobacco or drug or alcohol use.  She been married 8 years and her husband is in good health.  She is a Radio producer. Family history: No history of venous thromboembolism in the family. GYN history: No history of abnormal Pap smears or cervical surgeries.  Blood pressures today at her office were 131/98 and 126/79 mmHg. Ultrasound We performed a fetal anatomical survey.  Amniotic fluid is normal good fetal activity seen.  Fetal biometry is consistent with the previously established dates.  Important findings include: -Absent nasal bone. -The skull shaped appears abnormal (cloverleaf shaped) with bossing of frontal bones. Mild dolicocephaly is seen. -Female length at the 3rd percentile and humerus and ulna at the 5th percentile.  Other long bone measurements are within normal range. -Skeletal system including calvarium appear normal with no demineralization. -All long bones appear normal with no evidence of bowing. -Chest circumference measurement is within  normal range. -Intracranial structures and fetal spine appears normal. Facial anatomy including lips appear normal. Rest of the fetal anatomy appears normal.  Cardiac anatomy could not be completed because of fetal position. Marginal cord insertion is seen.  Our concerns include Absent nasal bone I explained the this is a marker for Down syndrome.  No other markers of Down syndrome are seen.  I counseled the patient that if her cell free fetal DNA screening showed low risk for Down syndrome, this should be considered a normal variant.  I informed her that we do not have the results of the cell free fetal DNA screening at present and will be reaching out to their office tomorrow.  I explained that only amniocentesis will give a definitive result on the fetal karyotype.  Marginal cord insertion I explained the finding with help of diagrams.  Marginal cord insertion is usually associated with good fetal outcomes and only uncommonly with fetal growth restriction.  I recommend fetal growth assessments every 4 weeks.  R/o craniosynostosis -Abnormally-shaped head raises the suspicion of craniosynostosis. I explained the diagnosis that it is difficult to detect on prenatal ultrasound especially at early gestational ages. If present, it will be more prominent in the third trimester. -There are 4 cranial sutures present in the fetal skull that usually close at 18 to 24 months of postnatal life. Premature closure is called craniosynostosis and leads to deformation of skull and abnormal shape. It can interfere with brain development and may require postnatal surgery.  -Prevalence is about 4 per 10,000 live births. Craniosynostosis is isolated in 75% of cases with good outcomes. Isolated craniosynostosis is more common in males. The infant may need evaluation and surgery. In about 25% of cases,  it can be associated with other genetic syndromes. In syndromic craniosynostosis, mutations of FGFR1, FGFR2 and FGFR3  genes are commonly involved.  I recommended genetic counseling. Some genetic conditions can be detected on amniocentesis. I explained the procedure and possible complications including miscarriage (1 in 500 procedures).  Patient was made aware the findings may be consistent with a completely normal head without evidence of craniosynostosis or it may be confirmed only later in gestation or after birth.  Patient would like to return for a follow-up ultrasound in weeks and decide on genetic counseling. She opted not to have amniocentesis. Regardless of potential anomalies, she will continue her pregnancy.  Recommendations -An appointment was made for her to return in 4 weeks for fetal growth assessment and evaluation of the cranium. -Genetic counseling at her next visit based on ultrasound findings. -Serial fetal growth assessments every 4 weeks. -Mode of delivery to be addressed in the third trimester.   Thank you for consultation.  If you have any questions or concerns, please contact me the [facility].  Consultation including face-to-face (more than 50%) counseling 45 minutes.

## 2021-12-26 ENCOUNTER — Ambulatory Visit: Payer: BC Managed Care – PPO | Attending: Obstetrics and Gynecology

## 2021-12-26 ENCOUNTER — Other Ambulatory Visit: Payer: Self-pay

## 2021-12-26 ENCOUNTER — Ambulatory Visit: Payer: BC Managed Care – PPO | Admitting: *Deleted

## 2021-12-26 VITALS — BP 124/79 | HR 82

## 2021-12-26 DIAGNOSIS — Z362 Encounter for other antenatal screening follow-up: Secondary | ICD-10-CM | POA: Diagnosis present

## 2021-12-26 DIAGNOSIS — O288 Other abnormal findings on antenatal screening of mother: Secondary | ICD-10-CM | POA: Diagnosis not present

## 2021-12-26 DIAGNOSIS — O321XX0 Maternal care for breech presentation, not applicable or unspecified: Secondary | ICD-10-CM | POA: Diagnosis not present

## 2021-12-26 DIAGNOSIS — Z3A26 26 weeks gestation of pregnancy: Secondary | ICD-10-CM | POA: Diagnosis not present

## 2021-12-26 DIAGNOSIS — O43192 Other malformation of placenta, second trimester: Secondary | ICD-10-CM | POA: Insufficient documentation

## 2021-12-26 DIAGNOSIS — Z363 Encounter for antenatal screening for malformations: Secondary | ICD-10-CM

## 2021-12-26 DIAGNOSIS — Z6841 Body Mass Index (BMI) 40.0 and over, adult: Secondary | ICD-10-CM | POA: Diagnosis present

## 2021-12-26 DIAGNOSIS — O99212 Obesity complicating pregnancy, second trimester: Secondary | ICD-10-CM | POA: Diagnosis not present

## 2021-12-27 ENCOUNTER — Other Ambulatory Visit: Payer: Self-pay | Admitting: *Deleted

## 2021-12-27 DIAGNOSIS — O43199 Other malformation of placenta, unspecified trimester: Secondary | ICD-10-CM

## 2021-12-27 DIAGNOSIS — Z6841 Body Mass Index (BMI) 40.0 and over, adult: Secondary | ICD-10-CM

## 2021-12-27 DIAGNOSIS — Q758 Other specified congenital malformations of skull and face bones: Secondary | ICD-10-CM

## 2022-01-16 ENCOUNTER — Other Ambulatory Visit: Payer: Self-pay

## 2022-01-16 ENCOUNTER — Encounter: Payer: BC Managed Care – PPO | Attending: Obstetrics and Gynecology | Admitting: Registered"

## 2022-01-16 DIAGNOSIS — O24419 Gestational diabetes mellitus in pregnancy, unspecified control: Secondary | ICD-10-CM | POA: Diagnosis not present

## 2022-01-21 ENCOUNTER — Encounter: Payer: Self-pay | Admitting: Registered"

## 2022-01-21 DIAGNOSIS — O24419 Gestational diabetes mellitus in pregnancy, unspecified control: Secondary | ICD-10-CM | POA: Insufficient documentation

## 2022-01-21 NOTE — Progress Notes (Addendum)
Patient was seen on 01/16/22 for Gestational Diabetes self-management class at the [facility]. The following learning objectives were met by the patient during this course: ? ?States the definition of Gestational Diabetes ?States why dietary management is important in controlling blood glucose ?Describes the effects each nutrient has on blood glucose levels ?Demonstrates ability to create a balanced meal plan ?Demonstrates carbohydrate counting  ?States when to check blood glucose levels ?Demonstrates proper blood glucose monitoring techniques ?States the effect of stress and exercise on blood glucose levels ?States the importance of limiting caffeine and abstaining from alcohol and smoking ? ?Blood glucose monitor given: OneTouch Verio Flex ?Lot# [id number] x ?Exp: 03/27/2026 ?Blood Glucose: 83 mg/dL ? ?Patient instructed to monitor glucose levels: ?FBS: 60 - <95; 1 hour: <140; 2 hour: <120 ? ?Patient received handouts: ?Nutrition Diabetes and Pregnancy, including carb counting list ? ?Patient will be seen for follow-up as needed. ?

## 2022-01-23 ENCOUNTER — Ambulatory Visit: Payer: BC Managed Care – PPO | Attending: Obstetrics and Gynecology

## 2022-01-23 ENCOUNTER — Ambulatory Visit: Payer: BC Managed Care – PPO | Admitting: *Deleted

## 2022-01-23 ENCOUNTER — Other Ambulatory Visit: Payer: Self-pay

## 2022-01-23 ENCOUNTER — Encounter: Payer: Self-pay | Admitting: *Deleted

## 2022-01-23 ENCOUNTER — Ambulatory Visit: Payer: BC Managed Care – PPO | Attending: Obstetrics and Gynecology | Admitting: Obstetrics and Gynecology

## 2022-01-23 ENCOUNTER — Other Ambulatory Visit: Payer: Self-pay | Admitting: *Deleted

## 2022-01-23 VITALS — BP 130/83 | HR 85

## 2022-01-23 DIAGNOSIS — O26893 Other specified pregnancy related conditions, third trimester: Secondary | ICD-10-CM | POA: Diagnosis not present

## 2022-01-23 DIAGNOSIS — O43193 Other malformation of placenta, third trimester: Secondary | ICD-10-CM | POA: Diagnosis not present

## 2022-01-23 DIAGNOSIS — O43199 Other malformation of placenta, unspecified trimester: Secondary | ICD-10-CM

## 2022-01-23 DIAGNOSIS — O35AXX0 Maternal care for other (suspected) fetal abnormality and damage, fetal facial anomalies, not applicable or unspecified: Secondary | ICD-10-CM | POA: Diagnosis not present

## 2022-01-23 DIAGNOSIS — O99212 Obesity complicating pregnancy, second trimester: Secondary | ICD-10-CM

## 2022-01-23 DIAGNOSIS — O24419 Gestational diabetes mellitus in pregnancy, unspecified control: Secondary | ICD-10-CM | POA: Diagnosis not present

## 2022-01-23 DIAGNOSIS — O99213 Obesity complicating pregnancy, third trimester: Secondary | ICD-10-CM

## 2022-01-23 DIAGNOSIS — O43103 Malformation of placenta, unspecified, third trimester: Secondary | ICD-10-CM | POA: Diagnosis not present

## 2022-01-23 DIAGNOSIS — E669 Obesity, unspecified: Secondary | ICD-10-CM

## 2022-01-23 DIAGNOSIS — Z3A30 30 weeks gestation of pregnancy: Secondary | ICD-10-CM

## 2022-01-23 DIAGNOSIS — Q758 Other specified congenital malformations of skull and face bones: Secondary | ICD-10-CM | POA: Diagnosis present

## 2022-01-23 DIAGNOSIS — Z6841 Body Mass Index (BMI) 40.0 and over, adult: Secondary | ICD-10-CM | POA: Insufficient documentation

## 2022-01-23 NOTE — Progress Notes (Signed)
Maternal-Fetal Medicine  ? ?Name: Vanessa Merritt ?DOB: 1991-05-21 ?MRN: [id number] ?Referring Provider: Damaris Hippo, MD ? ?I had the pleasure of seeing Ms. Can today at the [facility]. She is G1 P0 at 30w 4d gestation and is here for ultrasound evaluation. She has a new diagnosis of gestational diabetes. ?Patient has just started checking her blood glucose levels and reports her fasting levels are in the low 100s. ?Her blood pressure today at her office is 130/83 mmHg. ? ?On previous ultrasound, and abnormally shaped head consistent with a possible diagnosis of craniosynostosis was detected.  Marginal cord insertion was seen.  Patient had opted not to have amniocentesis or meet with our genetic counselor. ? ?Ultrasound ?Fetal growth is appropriate for gestational age.  Amniotic fluid is normal and good fetal activity seen.  Mild frontal bossing is seen.  Temporal indentation is seen again.  Proximal long bones appear short but good interval growth is seen.  The distal long bones are within normal range. ?Female appears normal with no angulations.  The hands could not be evaluated but no trident-shaped hands were seen at previous ultrasound.  The spine appears normal.  No evidence of hypomineralization. ?Both feet appear normal with no evidence of clubbing.  The chest appears normal. ? ?Differential diagnosis include: ?-Craniosynostosis ?-Achondroplasia (heterozygous) ?-Normal fetus. ?  ?Gestational diabetes ?I explained the diagnosis of gestational diabetes.  I emphasized the importance of good blood glucose control to prevent adverse fetal or neonatal outcomes.  I discussed blood glucose normal values. I encouraged her to check her blood glucose regularly. ?Possible complications of gestational diabetes include fetal macrosomia, shoulder dystocia and birth injuries, stillbirth (in poorly controlled diabetes) and neonatal respiratory syndrome and other complications. ? ?In about 85% of cases,  gestational diabetes is well controlled by diet alone.  Exercise reduces the need for insulin.  Medical treatment includes oral hypoglycemics or insulin. ? ?Timing of delivery: In well-controlled diabetes on diet, patient can be delivered at 34- or 40-weeks' gestation. Vaginal delivery is not contraindicated. ?Type 2 diabetes develops in up to 50% of women with GDM. I recommend postpartum screening with 75-g glucose load at 6 to 12 weeks after delivery. ?Recommendations: ?-Weekly BPP from 32 weeks' gestation till delivery that may be performed at your office. ?-An appointment was made for her to return in 4 weeks for fetal growth assessment. ?-Neonatal evaluation by the geneticist. ? ? ?Thank you for consultation.  If you have any questions or concerns, please contact me the [facility].  Consultation including face-to-face (more than 50%) counseling 30 minutes. ? ?

## 2022-02-20 ENCOUNTER — Ambulatory Visit: Payer: BC Managed Care – PPO | Attending: Obstetrics and Gynecology | Admitting: Obstetrics

## 2022-02-20 ENCOUNTER — Ambulatory Visit: Payer: BC Managed Care – PPO | Admitting: *Deleted

## 2022-02-20 ENCOUNTER — Ambulatory Visit: Payer: BC Managed Care – PPO | Attending: Obstetrics and Gynecology

## 2022-02-20 VITALS — BP 121/82 | HR 84

## 2022-02-20 DIAGNOSIS — E669 Obesity, unspecified: Secondary | ICD-10-CM

## 2022-02-20 DIAGNOSIS — O359XX0 Maternal care for (suspected) fetal abnormality and damage, unspecified, not applicable or unspecified: Secondary | ICD-10-CM | POA: Diagnosis not present

## 2022-02-20 DIAGNOSIS — O35AXX0 Maternal care for other (suspected) fetal abnormality and damage, fetal facial anomalies, not applicable or unspecified: Secondary | ICD-10-CM

## 2022-02-20 DIAGNOSIS — Z3A34 34 weeks gestation of pregnancy: Secondary | ICD-10-CM

## 2022-02-20 DIAGNOSIS — Q758 Other specified congenital malformations of skull and face bones: Secondary | ICD-10-CM | POA: Insufficient documentation

## 2022-02-20 DIAGNOSIS — O43193 Other malformation of placenta, third trimester: Secondary | ICD-10-CM

## 2022-02-20 DIAGNOSIS — O99213 Obesity complicating pregnancy, third trimester: Secondary | ICD-10-CM | POA: Insufficient documentation

## 2022-02-20 DIAGNOSIS — O24415 Gestational diabetes mellitus in pregnancy, controlled by oral hypoglycemic drugs: Secondary | ICD-10-CM | POA: Diagnosis not present

## 2022-02-21 NOTE — Progress Notes (Signed)
MFM Note ? ?Vanessa Merritt was seen for a follow up growth scan due to maternal obesity and gestational diabetes treated with glyburide.  A large fetal head and abdomen along with a shortened femur were noted on her prior ultrasound exams, increasing the suspicion for either craniosynostosis or achondroplasia.  She denies any problems since her last exam. ? ?She was informed that the fetal growth and amniotic fluid level appears appropriate for her gestational age.  The fetal head and abdomen measurements continue to measure large for her gestational age today, while the femur length remains short. ? ?A BPP performed today was 8 out of 8. ? ?The patient was advised to have her baby examined after birth to determine if achondroplasia or craniosynostosis is present. ? ?Due to maternal obesity and gestational diabetes, she is already undergoing twice-weekly fetal testing in your office.  She should continue fetal testing until delivery. ? ?Due to the large size of the fetal head and gestational diabetes, to increase her chances of a successful vaginal delivery, an induction of labor may be considered at around 38 weeks. ? ?A vaginal delivery may be attempted even if the fetus has craniosynostosis.  However due to the potential for an abnormal cranial contour, a vacuum-assisted vaginal delivery should be avoided. ? ?No further exams were scheduled in our office. ? ?The patient stated that all of her questions were answered today. ? ?A total of 20 minutes was spent counseling and coordinating the care for this patient.  Greater than 50% of the time was spent in direct face-to-face contact. ?

## 2022-03-01 LAB — OB RESULTS CONSOLE GBS: GBS: NEGATIVE

## 2022-03-06 ENCOUNTER — Telehealth (HOSPITAL_COMMUNITY): Payer: Self-pay | Admitting: *Deleted

## 2022-03-06 ENCOUNTER — Encounter (HOSPITAL_COMMUNITY): Payer: Self-pay

## 2022-03-06 NOTE — Telephone Encounter (Signed)
Preadmission screen  

## 2022-03-07 ENCOUNTER — Telehealth (HOSPITAL_COMMUNITY): Payer: Self-pay | Admitting: *Deleted

## 2022-03-07 NOTE — Telephone Encounter (Signed)
Preadmission screen  

## 2022-03-08 ENCOUNTER — Telehealth (HOSPITAL_COMMUNITY): Payer: Self-pay | Admitting: *Deleted

## 2022-03-08 NOTE — Telephone Encounter (Signed)
Preadmission screen  

## 2022-03-11 ENCOUNTER — Encounter (HOSPITAL_COMMUNITY): Payer: Self-pay | Admitting: *Deleted

## 2022-03-11 ENCOUNTER — Telehealth (HOSPITAL_COMMUNITY): Payer: Self-pay | Admitting: *Deleted

## 2022-03-11 NOTE — Telephone Encounter (Signed)
Preadmission screen  

## 2022-03-19 ENCOUNTER — Encounter (HOSPITAL_COMMUNITY): Payer: Self-pay | Admitting: Obstetrics and Gynecology

## 2022-03-19 NOTE — H&P (Signed)
Vanessa Merritt is a 31 y.o. 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 and shortened femur length  - plan geneticist referral after delivery  MFM has already seen the patient  Last scan 37 weeks 3275 gram BPD greater than 97%  Family history of Factor 2 mutation - declined testing during pregnancy  OB History     Gravida  1   Para      Term      Preterm      AB      Living         SAB      IAB      Ectopic      Multiple      Live Births             Past Medical History:  Diagnosis Date   Allergic rhinitis    Back pain    Childhood asthma    Diabetes mellitus without complication (HCC)    Gestational diabetes    Past Surgical History:  Procedure Laterality Date   WISDOM TOOTH EXTRACTION     Family History: family history includes Allergic rhinitis in her mother; Asthma in her mother; Diabetes in her paternal grandfather and paternal grandmother; Hyperlipidemia in her mother; Hypertension in her father; Hypothyroidism in her mother. Social History:  reports that she has never smoked. She has never used smokeless tobacco. She reports that she does not currently use alcohol after a past usage of about 2.0 standard drinks per week. She reports that she does not use drugs.     Maternal Diabetes:  A2 GDM Genetic Screening: Abnormal:  Results: Other: Maternal Ultrasounds/Referrals:  Fetal Ultrasounds or other Referrals:  Referred to Materal Fetal Medicine  Maternal Substance Abuse:  No Significant Maternal Medications:  None Significant Maternal Lab Results:  Group B Strep negative Other Comments:  None  Review of Systems  All other systems reviewed and are negative. History   Last menstrual period 04/17/2021. Exam Physical Exam Vitals and nursing note reviewed. Exam conducted with a chaperone present.  HENT:     Head: Normocephalic.      Mouth/Throat:     Mouth: Mucous membranes are moist.  Cardiovascular:     Rate and Rhythm: Normal rate and regular rhythm.    Prenatal labs: ABO, Rh: A/Positive/-- (10/25 0000) Antibody: Negative (10/25 0000) Rubella: Immune (10/25 0000) RPR: Nonreactive (10/25 0000)  HBsAg: Negative (10/25 0000)  HIV: Non-reactive (10/25 0000)  GBS: Negative/-- (05/05 0000)   Assessment/Plan: IUP at term GDM Fetal cranial abnormality IOL - discussed with patient risks of induction   Jeani Hawking 03/19/2022, 1:53 PM

## 2022-03-20 ENCOUNTER — Other Ambulatory Visit: Payer: Self-pay

## 2022-03-20 ENCOUNTER — Encounter (HOSPITAL_COMMUNITY): Payer: Self-pay | Admitting: Obstetrics and Gynecology

## 2022-03-20 ENCOUNTER — Inpatient Hospital Stay (HOSPITAL_COMMUNITY): Payer: BC Managed Care – PPO | Admitting: Anesthesiology

## 2022-03-20 ENCOUNTER — Encounter (HOSPITAL_COMMUNITY)
Admission: AD | Disposition: A | Discharge status: Home / Self Care | Payer: Self-pay | Source: Home / Self Care | Attending: Obstetrics and Gynecology

## 2022-03-20 ENCOUNTER — Inpatient Hospital Stay (HOSPITAL_COMMUNITY)
Admission: AD | Admit: 2022-03-20 | Discharge: 2022-03-22 | DRG: 788 | Disposition: A | Discharge status: Home / Self Care | Payer: BC Managed Care – PPO | Attending: Obstetrics and Gynecology | Admitting: Obstetrics and Gynecology

## 2022-03-20 ENCOUNTER — Inpatient Hospital Stay (HOSPITAL_COMMUNITY): Payer: BC Managed Care – PPO

## 2022-03-20 DIAGNOSIS — Z3A38 38 weeks gestation of pregnancy: Secondary | ICD-10-CM | POA: Diagnosis not present

## 2022-03-20 DIAGNOSIS — O358XX0 Maternal care for other (suspected) fetal abnormality and damage, not applicable or unspecified: Secondary | ICD-10-CM | POA: Diagnosis present

## 2022-03-20 DIAGNOSIS — O24419 Gestational diabetes mellitus in pregnancy, unspecified control: Principal | ICD-10-CM | POA: Diagnosis present

## 2022-03-20 DIAGNOSIS — O24425 Gestational diabetes mellitus in childbirth, controlled by oral hypoglycemic drugs: Secondary | ICD-10-CM | POA: Diagnosis present

## 2022-03-20 DIAGNOSIS — Z98891 History of uterine scar from previous surgery: Secondary | ICD-10-CM

## 2022-03-20 DIAGNOSIS — O99214 Obesity complicating childbirth: Secondary | ICD-10-CM | POA: Diagnosis present

## 2022-03-20 HISTORY — DX: Other specified behavioral and emotional disorders with onset usually occurring in childhood and adolescence: F98.8

## 2022-03-20 LAB — CBC
HCT: 33.2 % — ABNORMAL LOW (ref 36.0–46.0)
Hemoglobin: 11.4 g/dL — ABNORMAL LOW (ref 12.0–15.0)
MCH: 30.1 pg (ref 26.0–34.0)
MCHC: 34.3 g/dL (ref 30.0–36.0)
MCV: 87.6 fL (ref 80.0–100.0)
Platelets: 270 10*3/uL (ref 150–400)
RBC: 3.79 MIL/uL — ABNORMAL LOW (ref 3.87–5.11)
RDW: 12.9 % (ref 11.5–15.5)
WBC: 13.5 10*3/uL — ABNORMAL HIGH (ref 4.0–10.5)
nRBC: 0 % (ref 0.0–0.2)

## 2022-03-20 LAB — TYPE AND SCREEN
ABO/RH(D): A POS
Antibody Screen: NEGATIVE

## 2022-03-20 LAB — RPR: RPR Ser Ql: NONREACTIVE

## 2022-03-20 LAB — GLUCOSE, CAPILLARY
Glucose-Capillary: 93 mg/dL (ref 70–99)
Glucose-Capillary: 59 mg/dL — ABNORMAL LOW (ref 70–99)
Glucose-Capillary: 78 mg/dL (ref 70–99)
Glucose-Capillary: 78 mg/dL (ref 70–99)

## 2022-03-20 SURGERY — Surgical Case
Anesthesia: Spinal

## 2022-03-20 MED ORDER — ONDANSETRON HCL 4 MG/2ML IJ SOLN
INTRAMUSCULAR | Status: AC
  Filled 2022-03-20: qty 2

## 2022-03-20 MED ORDER — DIPHENHYDRAMINE HCL 25 MG PO CAPS: 25.0000 mg | ORAL_CAPSULE | ORAL | Status: DC | PRN

## 2022-03-20 MED ORDER — SODIUM CHLORIDE 0.9% FLUSH: 3.0000 mL | INTRAVENOUS | Status: DC | PRN

## 2022-03-20 MED ORDER — PROMETHAZINE HCL 25 MG/ML IJ SOLN: 6.2500 mg | INTRAMUSCULAR | Status: DC | PRN

## 2022-03-20 MED ORDER — BUPIVACAINE IN DEXTROSE 0.75-8.25 % IT SOLN
INTRATHECAL | Status: DC | PRN
  Administered 2022-03-20: 1.5 mL via INTRATHECAL

## 2022-03-20 MED ORDER — ACETAMINOPHEN 325 MG PO TABS: 650.0000 mg | ORAL_TABLET | ORAL | Status: DC | PRN

## 2022-03-20 MED ORDER — SIMETHICONE 80 MG PO CHEW: 80.0000 mg | CHEWABLE_TABLET | ORAL | Status: DC | PRN

## 2022-03-20 MED ORDER — DIPHENHYDRAMINE HCL 25 MG PO CAPS
25.0000 mg | ORAL_CAPSULE | Freq: Four times a day (QID) | ORAL | Status: DC | PRN
Start: 2022-03-20 — End: 2022-03-23

## 2022-03-20 MED ORDER — LACTATED RINGERS IV SOLN: 500.0000 mL | INTRAVENOUS | Status: DC | PRN

## 2022-03-20 MED ORDER — TETANUS-DIPHTH-ACELL PERTUSSIS 5-2.5-18.5 LF-MCG/0.5 IM SUSY: 0.5000 mL | PREFILLED_SYRINGE | Freq: Once | INTRAMUSCULAR | Status: DC

## 2022-03-20 MED ORDER — ACETAMINOPHEN 10 MG/ML IV SOLN
INTRAVENOUS | Status: AC
  Filled 2022-03-20: qty 100

## 2022-03-20 MED ORDER — MEPERIDINE HCL 25 MG/ML IJ SOLN: 6.2500 mg | INTRAMUSCULAR | Status: DC | PRN

## 2022-03-20 MED ORDER — NALOXONE HCL 0.4 MG/ML IJ SOLN: 0.4000 mg | INTRAMUSCULAR | Status: DC | PRN

## 2022-03-20 MED ORDER — PRENATAL MULTIVITAMIN CH
1.0000 | ORAL_TABLET | Freq: Every day | ORAL | Status: DC
  Administered 2022-03-21 – 2022-03-22 (×2): 1 via ORAL
  Filled 2022-03-20 (×2): qty 1

## 2022-03-20 MED ORDER — FENTANYL CITRATE (PF) 100 MCG/2ML IJ SOLN
INTRAMUSCULAR | Status: DC | PRN
Start: 2022-03-20 — End: 2022-03-20
  Administered 2022-03-20: 15 ug via INTRATHECAL

## 2022-03-20 MED ORDER — ZOLPIDEM TARTRATE 5 MG PO TABS
5.0000 mg | ORAL_TABLET | Freq: Every evening | ORAL | Status: DC | PRN
Start: 2022-03-20 — End: 2022-03-23

## 2022-03-20 MED ORDER — DEXTROSE 5 % IV SOLN
INTRAVENOUS | Status: DC | PRN
  Administered 2022-03-20: 3 g via INTRAVENOUS

## 2022-03-20 MED ORDER — MENTHOL 3 MG MT LOZG: 1.0000 | LOZENGE | OROMUCOSAL | Status: DC | PRN

## 2022-03-20 MED ORDER — MISOPROSTOL 25 MCG QUARTER TABLET
25.0000 ug | ORAL_TABLET | ORAL | Status: DC | PRN
  Administered 2022-03-20 (×3): 25 ug via VAGINAL
  Filled 2022-03-20 (×3): qty 1

## 2022-03-20 MED ORDER — FENTANYL CITRATE (PF) 100 MCG/2ML IJ SOLN
INTRAMUSCULAR | Status: AC
  Filled 2022-03-20: qty 2

## 2022-03-20 MED ORDER — MORPHINE SULFATE (PF) 0.5 MG/ML IJ SOLN
INTRAMUSCULAR | Status: DC | PRN
  Administered 2022-03-20: 150 ug via INTRATHECAL

## 2022-03-20 MED ORDER — PHENYLEPHRINE HCL-NACL 20-0.9 MG/250ML-% IV SOLN
INTRAVENOUS | Status: DC | PRN
  Administered 2022-03-20: 60 ug/min via INTRAVENOUS

## 2022-03-20 MED ORDER — LACTATED RINGERS IV SOLN: INTRAVENOUS | Status: DC

## 2022-03-20 MED ORDER — OXYCODONE-ACETAMINOPHEN 5-325 MG PO TABS: 1.0000 | ORAL_TABLET | ORAL | Status: DC | PRN

## 2022-03-20 MED ORDER — TERBUTALINE SULFATE 1 MG/ML IJ SOLN: 0.2500 mg | Freq: Once | INTRAMUSCULAR | Status: DC | PRN

## 2022-03-20 MED ORDER — NALOXONE HCL 4 MG/10ML IJ SOLN: 1.0000 ug/kg/h | INTRAVENOUS | Status: DC | PRN

## 2022-03-20 MED ORDER — OXYTOCIN-SODIUM CHLORIDE 30-0.9 UT/500ML-% IV SOLN
INTRAVENOUS | Status: DC | PRN
  Administered 2022-03-20: 300 mL via INTRAVENOUS

## 2022-03-20 MED ORDER — ONDANSETRON HCL 4 MG/2ML IJ SOLN: 4.0000 mg | Freq: Four times a day (QID) | INTRAMUSCULAR | Status: DC | PRN

## 2022-03-20 MED ORDER — SENNOSIDES-DOCUSATE SODIUM 8.6-50 MG PO TABS
2.0000 | ORAL_TABLET | Freq: Every day | ORAL | Status: DC
  Administered 2022-03-21 – 2022-03-22 (×2): 2 via ORAL
  Filled 2022-03-20 (×2): qty 2

## 2022-03-20 MED ORDER — ONDANSETRON HCL 4 MG/2ML IJ SOLN
INTRAMUSCULAR | Status: DC | PRN
  Administered 2022-03-20: 4 mg via INTRAVENOUS

## 2022-03-20 MED ORDER — TRAMADOL HCL 50 MG PO TABS: 50.0000 mg | ORAL_TABLET | Freq: Four times a day (QID) | ORAL | Status: DC | PRN

## 2022-03-20 MED ORDER — MORPHINE SULFATE (PF) 0.5 MG/ML IJ SOLN
INTRAMUSCULAR | Status: AC
  Filled 2022-03-20: qty 10

## 2022-03-20 MED ORDER — CEFAZOLIN IN SODIUM CHLORIDE 3-0.9 GM/100ML-% IV SOLN
INTRAVENOUS | Status: AC
  Filled 2022-03-20: qty 100

## 2022-03-20 MED ORDER — IBUPROFEN 600 MG PO TABS
600.0000 mg | ORAL_TABLET | Freq: Four times a day (QID) | ORAL | Status: DC
  Administered 2022-03-20 – 2022-03-22 (×8): 600 mg via ORAL
  Filled 2022-03-20 (×8): qty 1

## 2022-03-20 MED ORDER — SIMETHICONE 80 MG PO CHEW
80.0000 mg | CHEWABLE_TABLET | Freq: Three times a day (TID) | ORAL | Status: DC
  Administered 2022-03-21 – 2022-03-22 (×4): 80 mg via ORAL
  Filled 2022-03-20 (×4): qty 1

## 2022-03-20 MED ORDER — KETOROLAC TROMETHAMINE 30 MG/ML IJ SOLN: 30.0000 mg | Freq: Once | INTRAMUSCULAR | Status: DC | PRN

## 2022-03-20 MED ORDER — OXYTOCIN-SODIUM CHLORIDE 30-0.9 UT/500ML-% IV SOLN: 2.5000 [IU]/h | INTRAVENOUS | Status: DC

## 2022-03-20 MED ORDER — ONDANSETRON HCL 4 MG/2ML IJ SOLN: 4.0000 mg | Freq: Three times a day (TID) | INTRAMUSCULAR | Status: DC | PRN

## 2022-03-20 MED ORDER — LACTATED RINGERS IV SOLN: INTRAVENOUS | Status: DC | PRN

## 2022-03-20 MED ORDER — DEXAMETHASONE SODIUM PHOSPHATE 10 MG/ML IJ SOLN
INTRAMUSCULAR | Status: DC | PRN
  Administered 2022-03-20: 4 mg via INTRAVENOUS

## 2022-03-20 MED ORDER — HYDROMORPHONE HCL 1 MG/ML IJ SOLN: 0.2500 mg | INTRAMUSCULAR | Status: DC | PRN

## 2022-03-20 MED ORDER — PHENYLEPHRINE HCL-NACL 20-0.9 MG/250ML-% IV SOLN
INTRAVENOUS | Status: AC
  Filled 2022-03-20: qty 250

## 2022-03-20 MED ORDER — ACETAMINOPHEN 10 MG/ML IV SOLN
INTRAVENOUS | Status: DC | PRN
  Administered 2022-03-20: 1000 mg via INTRAVENOUS

## 2022-03-20 MED ORDER — OXYTOCIN-SODIUM CHLORIDE 30-0.9 UT/500ML-% IV SOLN
2.5000 [IU]/h | INTRAVENOUS | Status: AC
  Administered 2022-03-20: 2.5 [IU]/h via INTRAVENOUS
  Filled 2022-03-20: qty 500

## 2022-03-20 MED ORDER — OXYTOCIN-SODIUM CHLORIDE 30-0.9 UT/500ML-% IV SOLN: 1.0000 m[IU]/min | INTRAVENOUS | Status: DC

## 2022-03-20 MED ORDER — OXYTOCIN BOLUS FROM INFUSION: 333.0000 mL | Freq: Once | INTRAVENOUS | Status: DC

## 2022-03-20 MED ORDER — SCOPOLAMINE 1 MG/3DAYS TD PT72: 1.0000 | MEDICATED_PATCH | Freq: Once | TRANSDERMAL | Status: DC

## 2022-03-20 MED ORDER — OXYTOCIN-SODIUM CHLORIDE 30-0.9 UT/500ML-% IV SOLN
INTRAVENOUS | Status: AC
  Filled 2022-03-20: qty 500

## 2022-03-20 MED ORDER — PHENYLEPHRINE 80 MCG/ML (10ML) SYRINGE FOR IV PUSH (FOR BLOOD PRESSURE SUPPORT)
PREFILLED_SYRINGE | INTRAVENOUS | Status: DC | PRN
Start: 2022-03-20 — End: 2022-03-20

## 2022-03-20 MED ORDER — LIDOCAINE HCL (PF) 1 % IJ SOLN: 30.0000 mL | INTRAMUSCULAR | Status: DC | PRN

## 2022-03-20 MED ORDER — DIBUCAINE (PERIANAL) 1 % EX OINT: 1.0000 "application " | TOPICAL_OINTMENT | CUTANEOUS | Status: DC | PRN

## 2022-03-20 MED ORDER — OXYCODONE HCL 5 MG PO TABS
5.0000 mg | ORAL_TABLET | ORAL | Status: DC | PRN
  Administered 2022-03-22 (×2): 5 mg via ORAL
  Filled 2022-03-20 (×2): qty 1

## 2022-03-20 MED ORDER — DIPHENHYDRAMINE HCL 50 MG/ML IJ SOLN: 12.5000 mg | INTRAMUSCULAR | Status: DC | PRN

## 2022-03-20 MED ORDER — OXYCODONE-ACETAMINOPHEN 5-325 MG PO TABS: 2.0000 | ORAL_TABLET | ORAL | Status: DC | PRN

## 2022-03-20 MED ORDER — COCONUT OIL OIL
1.0000 "application " | TOPICAL_OIL | Status: DC | PRN
  Administered 2022-03-22: 1 via TOPICAL

## 2022-03-20 MED ORDER — WITCH HAZEL-GLYCERIN EX PADS: 1.0000 "application " | MEDICATED_PAD | CUTANEOUS | Status: DC | PRN

## 2022-03-20 MED ORDER — SOD CITRATE-CITRIC ACID 500-334 MG/5ML PO SOLN
30.0000 mL | ORAL | Status: DC | PRN
  Administered 2022-03-20: 30 mL via ORAL
  Filled 2022-03-20: qty 30

## 2022-03-20 SURGICAL SUPPLY — 34 items
APL SKNCLS STERI-STRIP NONHPOA (GAUZE/BANDAGES/DRESSINGS) ×1
BARRIER ADHS 3X4 INTERCEED (GAUZE/BANDAGES/DRESSINGS) IMPLANT
BENZOIN TINCTURE PRP APPL 2/3 (GAUZE/BANDAGES/DRESSINGS) ×1 IMPLANT
BRR ADH 4X3 ABS CNTRL BYND (GAUZE/BANDAGES/DRESSINGS)
CHLORAPREP W/TINT 26ML (MISCELLANEOUS) ×4 IMPLANT
CLAMP CORD UMBIL (MISCELLANEOUS) ×2 IMPLANT
CLOTH BEACON ORANGE TIMEOUT ST (SAFETY) ×2 IMPLANT
DRSG OPSITE POSTOP 4X10 (GAUZE/BANDAGES/DRESSINGS) ×2 IMPLANT
ELECT REM PT RETURN 9FT ADLT (ELECTROSURGICAL) ×2
ELECTRODE REM PT RTRN 9FT ADLT (ELECTROSURGICAL) ×1 IMPLANT
EXTRACTOR VACUUM M CUP 4 TUBE (SUCTIONS) IMPLANT
GLOVE BIO SURGEON STRL SZ 6.5 (GLOVE) ×2 IMPLANT
GLOVE BIOGEL PI IND STRL 7.0 (GLOVE) ×1 IMPLANT
GLOVE BIOGEL PI INDICATOR 7.0 (GLOVE) ×1
GOWN STRL REUS W/TWL LRG LVL3 (GOWN DISPOSABLE) ×4 IMPLANT
KIT ABG SYR 3ML LUER SLIP (SYRINGE) IMPLANT
NDL HYPO 25X5/8 SAFETYGLIDE (NEEDLE) ×1 IMPLANT
NEEDLE HYPO 22GX1.5 SAFETY (NEEDLE) IMPLANT
NEEDLE HYPO 25X5/8 SAFETYGLIDE (NEEDLE) ×2 IMPLANT
NS IRRIG 1000ML POUR BTL (IV SOLUTION) ×2 IMPLANT
PACK C SECTION WH (CUSTOM PROCEDURE TRAY) ×2 IMPLANT
PAD OB MATERNITY 4.3X12.25 (PERSONAL CARE ITEMS) ×2 IMPLANT
STRIP SURGICAL 1/4 X 6 IN (GAUZE/BANDAGES/DRESSINGS) ×1 IMPLANT
SUT CHROMIC 0 CTX 36 (SUTURE) ×5 IMPLANT
SUT PLAIN 0 NONE (SUTURE) IMPLANT
SUT PLAIN 2 0 XLH (SUTURE) IMPLANT
SUT VIC AB 0 CT1 27 (SUTURE) ×6
SUT VIC AB 0 CT1 27XBRD ANBCTR (SUTURE) ×3 IMPLANT
SUT VIC AB 4-0 KS 27 (SUTURE) IMPLANT
SYR CONTROL 10ML LL (SYRINGE) IMPLANT
TOWEL OR 17X24 6PK STRL BLUE (TOWEL DISPOSABLE) ×2 IMPLANT
TRAY FOLEY W/BAG SLVR 14FR LF (SET/KITS/TRAYS/PACK) ×2 IMPLANT
VACUUM CUP M-STYLE MYSTIC II (SUCTIONS) IMPLANT
WATER STERILE IRR 1000ML POUR (IV SOLUTION) ×2 IMPLANT

## 2022-03-20 NOTE — Lactation Note (Signed)
This note was copied from a baby's chart. Lactation Consultation Note  Patient Name: Vanessa Merritt Date: 03/20/2022 Reason for consult: Initial assessment;1st time breastfeeding;Early term 37-38.6wks;Maternal endocrine disorder;Difficult latch Age:29 hours, C/S delivery mom with GDM see mom's MR. P1, ETI female infant. Per mom, current feeding choice is breast and formula feeding infant. Mom attempted to latch infant on her right breast, infant latched but only held nipple in mouth at this time. LC attempted suck training infant only held nipple in mouth. Mom taught hand expression by LC using the breast model, mom self expressed, infant was given 1 ml of colostrum by spoon. LC observed infant may have short , thick band of tissue teeters bottom of tongue  to floor of mouth( lingual frenulum). Mom was set up with DEBP, mom was pumping when LC left the room, mom knows to pump every 3 hours for 15 minutes on initial setting. Mom's current feeding plan: 1- Mom will continue to BF infant according to hunger cues, on demand, 8 to 12+ times within 24 hours, skin to skin. 2- Mom will continue to latch infant at the breast, mom will ask for further latch assistance from RN/LC if needed , mom knows if infant doesn't latch she can hand express giving infant her EBM first  or offer formula ( her choice). 3- Mom will continue to use DEBP every 3 hours for 15 minutes on initial setting, mom will offer any her EBM first before supplementing infant with formula.  Maternal Data Has patient been taught Hand Expression?: Yes  Feeding Mother's Current Feeding Choice: Breast Milk and Formula  LATCH Score Latch: Too sleepy or reluctant, no latch achieved, no sucking elicited.  Audible Swallowing: None  Type of Nipple: Everted at rest and after stimulation  Comfort (Breast/Nipple): Soft / non-tender  Hold (Positioning): Assistance needed to correctly position infant at breast and maintain  latch.  LATCH Score: 5   Lactation Tools Discussed/Used Tools: Pump Breast pump type: Double-Electric Breast Pump Pump Education: Setup, frequency, and cleaning;Milk Storage Reason for Pumping: GDM, C/S, ETI and infant not latching at breast Pumping frequency: Mom will continue to use DEBP every 3 hours for 15 minutes on inital setting.  Interventions Interventions: Breast feeding basics reviewed;Assisted with latch;Skin to skin;Hand express;Breast compression;Adjust position;Support pillows;Position options;Expressed milk;DEBP;Education;LC Services brochure  Discharge    Consult Status Consult Status: Follow-up Date: 03/21/22 Follow-up type: In-patient    Vanessa Merritt 03/20/2022, 10:26 PM

## 2022-03-20 NOTE — Progress Notes (Signed)
Patient doing well Just received 2nd cytotec FHR Category 1  Toco infrequent contractions Will administer next cytotec  Monitor FHR  NICU at delivery

## 2022-03-20 NOTE — Progress Notes (Signed)
Pt would like information about these before leaving the hospital

## 2022-03-20 NOTE — Progress Notes (Signed)
Patient is status post 3 cytotec No pain. FHR category 1 Cervix is long/tight 1 cm head is very high and ballotable Patient does not want a long induction - had expressed desire for c section if long labor was a possibility because of her concern about the baby's head and the stress on the head during labor I agree Recommend c section NICU at delivery  Risks reviewed Consent signed OR notified

## 2022-03-20 NOTE — Anesthesia Preprocedure Evaluation (Addendum)
Anesthesia Evaluation  Patient identified by MRN, date of birth, ID band Patient awake    Reviewed: Allergy & Precautions, NPO status , Patient's Chart, lab work & pertinent test results  Airway Mallampati: II  TM Distance: >3 FB Neck ROM: Full    Dental no notable dental hx. (+) Dental Advisory Given   Pulmonary asthma ,    Pulmonary exam normal breath sounds clear to auscultation       Cardiovascular negative cardio ROS Normal cardiovascular exam Rhythm:Regular Rate:Normal     Neuro/Psych negative neurological ROS     GI/Hepatic negative GI ROS, Neg liver ROS,   Endo/Other  diabetesMorbid obesity  Renal/GU negative Renal ROS     Musculoskeletal negative musculoskeletal ROS (+)   Abdominal   Peds  Hematology negative hematology ROS (+)   Anesthesia Other Findings   Reproductive/Obstetrics (+) Pregnancy                            Anesthesia Physical Anesthesia Plan  ASA: 3  Anesthesia Plan: Spinal   Post-op Pain Management: Toradol IV (intra-op)* and Ofirmev IV (intra-op)*   Induction: Intravenous  PONV Risk Score and Plan: 4 or greater and Ondansetron, Propofol infusion, Treatment may vary due to age or medical condition and Scopolamine patch - Pre-op  Airway Management Planned: Natural Airway  Additional Equipment:   Intra-op Plan:   Post-operative Plan:   Informed Consent: I have reviewed the patients History and Physical, chart, labs and discussed the procedure including the risks, benefits and alternatives for the proposed anesthesia with the patient or authorized representative who has indicated his/her understanding and acceptance.     Dental advisory given  Plan Discussed with: CRNA  Anesthesia Plan Comments:         Anesthesia Quick Evaluation

## 2022-03-20 NOTE — Transfer of Care (Signed)
Immediate Anesthesia Transfer of Care Note  Patient: Vanessa Merritt  Procedure(s) Performed: CESAREAN SECTION  Patient Location: PACU  Anesthesia Type:Spinal  Level of Consciousness: awake  Airway & Oxygen Therapy: Patient Spontanous Breathing  Post-op Assessment: Report given to RN and Post -op Vital signs reviewed and stable  Post vital signs: Reviewed and stable  Last Vitals:  Vitals Value Taken Time  BP 99/62 03/20/22 1810  Temp    Pulse 59 03/20/22 1813  Resp 18 03/20/22 1813  SpO2 99 % 03/20/22 1813  Vitals shown include unvalidated device data.  Last Pain:  Vitals:   03/20/22 1520  TempSrc: Oral  PainSc:          Complications: No notable events documented.

## 2022-03-20 NOTE — Anesthesia Postprocedure Evaluation (Signed)
Anesthesia Post Note  Patient: Stefanny Pieri Enzor  Procedure(s) Performed: CESAREAN SECTION     Patient location during evaluation: PACU Anesthesia Type: Spinal Level of consciousness: awake and alert Pain management: pain level controlled Vital Signs Assessment: post-procedure vital signs reviewed and stable Respiratory status: spontaneous breathing and respiratory function stable Cardiovascular status: blood pressure returned to baseline and stable Postop Assessment: spinal receding Anesthetic complications: no   No notable events documented.  Last Vitals:  Vitals:   03/20/22 1951 03/20/22 2055  BP: 101/82 125/79  Pulse: (!) 53 (!) 50  Resp: 18 18  Temp:    SpO2: 100% 100%    Last Pain:  Vitals:   03/20/22 2055  TempSrc:   PainSc: 2    Pain Goal:                Epidural/Spinal Function Cutaneous sensation: Normal sensation (03/20/22 2055), Patient able to flex knees: Yes (03/20/22 2055), Patient able to lift hips off bed: Yes (03/20/22 2055), Back pain beyond tenderness at insertion site: No (03/20/22 2055), Progressively worsening motor and/or sensory loss: No (03/20/22 2055), Bowel and/or bladder incontinence post epidural: No (03/20/22 2055)  Kennieth Rad

## 2022-03-20 NOTE — Anesthesia Procedure Notes (Addendum)
Spinal  Patient location during procedure: OR Start time: 03/20/2022 5:06 PM End time: 03/20/2022 5:11 PM Reason for block: surgical anesthesia Staffing Performed: anesthesiologist  Anesthesiologist: Nolon Nations, MD Preanesthetic Checklist Completed: patient identified, IV checked, site marked, risks and benefits discussed, surgical consent, monitors and equipment checked, pre-op evaluation and timeout performed Spinal Block Patient position: sitting Prep: DuraPrep and site prepped and draped Patient monitoring: heart rate, continuous pulse ox and blood pressure Approach: midline Location: L3-4 Injection technique: single-shot Needle Needle type: Spinocan  Needle gauge: 25 G Needle length: 9 cm Additional Notes Expiration date of kit checked and confirmed. Patient tolerated procedure well, without complications.

## 2022-03-20 NOTE — Brief Op Note (Signed)
03/20/2022  5:56 PM  PATIENT:  Vanessa Merritt  31 y.o. female  PRE-OPERATIVE DIAGNOSIS:  IUP at 79 w 4 days Failed IOL Craniosynestosis   POST-OPERATIVE DIAGNOSIS:  Same Short umbilical cord  PROCEDURE:  Procedure(s): CESAREAN SECTION (N/A)  SURGEON:  Surgeon(s) and Role:    * Marcelle Overlie, MD - Primary  PHYSICIAN ASSISTANT:   ASSISTANTS: none   ANESTHESIA:   spinal  EBL:  per anesthesia record   BLOOD ADMINISTERED:none  DRAINS: Urinary Catheter (Foley)   LOCAL MEDICATIONS USED:  NONE  SPECIMEN:  No Specimen  DISPOSITION OF SPECIMEN:  N/A  COUNTS:  YES  TOURNIQUET:  * No tourniquets in log *  DICTATION: .Other Dictation: Dictation Number dictated  PLAN OF CARE: Admit to inpatient   PATIENT DISPOSITION:  PACU - hemodynamically stable.   Delay start of Pharmacological VTE agent (>24hrs) due to surgical blood loss or risk of bleeding: not applicable

## 2022-03-20 NOTE — Progress Notes (Incomplete)
Hypoglycemic Event  CBG: 59  Treatment: 4 oz juice/soda  Symptoms: None  Follow-up CBG: Time:*** CBG Result:***  Possible Reasons for Event: {Possible Reasons for CI:[id number]  Comments/MD notified:***    Royetta Asal

## 2022-03-21 ENCOUNTER — Encounter (HOSPITAL_COMMUNITY): Payer: Self-pay | Admitting: Obstetrics and Gynecology

## 2022-03-21 LAB — CBC
HCT: 31.9 % — ABNORMAL LOW (ref 36.0–46.0)
Hemoglobin: 11 g/dL — ABNORMAL LOW (ref 12.0–15.0)
MCH: 29.8 pg (ref 26.0–34.0)
MCHC: 34.5 g/dL (ref 30.0–36.0)
MCV: 86.4 fL (ref 80.0–100.0)
Platelets: 257 10*3/uL (ref 150–400)
RBC: 3.69 MIL/uL — ABNORMAL LOW (ref 3.87–5.11)
RDW: 12.8 % (ref 11.5–15.5)
WBC: 19.4 10*3/uL — ABNORMAL HIGH (ref 4.0–10.5)
nRBC: 0 % (ref 0.0–0.2)

## 2022-03-21 NOTE — Progress Notes (Signed)
Subjective: Postpartum Day 1: Cesarean Delivery Patient reports tolerating PO. Has ambulated and has voided.    Objective: Vital signs in last 24 hours: Temp:  [97.7 F (36.5 C)-97.8 F (36.6 C)] 97.7 F (36.5 C) (05/24 2148) Pulse Rate:  [50-73] 50 (05/24 2300) Resp:  [10-18] 18 (05/24 2300) BP: (99-140)/(58-88) 131/81 (05/25 0332) SpO2:  [98 %-100 %] 100 % (05/24 2300)  Physical Exam:  General: alert and cooperative Lochia: appropriate Uterine Fundus: firm Incision: healing well DVT Evaluation: No evidence of DVT seen on physical exam.  Recent Labs    03/20/22 0147 03/21/22 0503  HGB 11.4* 11.0*  HCT 33.2* 31.9*    Assessment/Plan: Status post Cesarean section. Doing well postoperatively.  Continue current care. Baby not ready for circ yet - peds awaiting conversation with geneticist.    Ranae Pila 03/21/2022, 9:36 AM

## 2022-03-21 NOTE — Op Note (Signed)
NAMECHIVON, BATOR MEDICAL RECORD NO: [id number] ACCOUNT NO: [id number] DATE OF BIRTH: [birth year] FACILITY: [facility] LOCATION: [facility] PHYSICIAN: Sharyn Lull L. 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  PROCEDURE:  Primary low transverse cesarean section.  SURGEON:  Brayant Dorr L. Helane Rima, MD.  ANESTHESIA:  Spinal.  ESTIMATED BLOOD LOSS:  Per anesthesia record.  DRAINS:  Foley catheter.  COMPLICATIONS:  None.  PATHOLOGY:  None.  DESCRIPTION OF PROCEDURE:  The patient was taken to the operating room.  Her spinal was placed by the anesthesiologist without incident.  She was then prepped and draped in the usual sterile fashion. A Foley catheter had been inserted and draining clear  urine.  A low transverse incision was made and it was carried down to the fascia.  Fascia was scored in the midline and extended laterally.  The rectus muscles were separated in the midline.  The peritoneum was entered bluntly and the peritoneal incision  was then stretched.  The lower uterine segment was identified and the bladder flap was developed in standard fashion.  The bladder blade was inserted.  A low transverse incision was made.  Amniotomy was performed.  Amniotic fluid was clear.  The baby  was in cephalic presentation, was delivered easily and was a female infant.  Cord was clamped and cut and the baby was handed to the waiting neonatal team.  The baby was vigorous in the operating room.  The cord blood was obtained.  The placenta was  manually removed, noted to be normal, intact with a 3-vessel cord.  The uterus was cleared of all clots and debris.  The uterine incision was closed in  one layer and using 0 chromic in a running locked stitch.  Hemostasis was very good.  The uterus was  returned to the abdomen.  Irrigation was performed.  The peritoneum was closed using 0 Vicryl.  The fascia was closed using 0 Vicryl starting in each corner and meeting in the midline.  After irrigation of the subcutaneous layer, the subcutaneous was  closed with a plain gut suture.  The skin was closed with subcuticular using 3-0 Vicryl on a Keith needle.  All sponge, lap and instrument counts were correct x2.  The patient went to recovery room in stable condition after honeycomb dressing was applied  to the abdomen.  All sponge, lap and instrument counts were correct x2.   PUS D: 03/21/2022 9:01:00 am T: 03/21/2022 12:21:00 pm  JOB: [id number]

## 2022-03-21 NOTE — Lactation Note (Signed)
This note was copied from a baby's chart. Lactation Consultation Note  Patient Name: Vanessa Merritt [id number] Date: 03/21/2022 Reason for consult: Follow-up assessment Age:29 hours P1 , 1st time BF ,  Per mom the baby has fed a few times today but has been sluggish and the  Consult last night set up a DEBP and I have been pumping and only few ml.  LC offered to assist to latch and mom receptive.  LC checked and changed a wet and stool.  LC reviewed the doc flow sheets WNL for age .  Baby latched for a few sucks and sleepy, even STS.  @1914  discussed with mom since the baby has been sluggish , LC recommended to continue trying to latch and if baby latches feed 15 -20 mins , and supplement.   LC discussed donor milk or formula . Mom decided on donor milk , consent signed and LC showed mom how to pace feed . The Baby tolerated the standard nipple well and took 5 ml . More alert and mom aware it is available tonight.  MBURN aware at change of shift,.  Mom had pumped at consult and is aware of storage of breastmilk. LC recommended and encouraged mom to call for assistance as needed.    Maternal Data Has patient been taught Hand Expression?: Yes  Feeding Mother's Current Feeding Choice: Breast Milk and Donor Milk Nipple Type: Nfant Standard Flow (white)  LATCH Score Latch: Repeated attempts needed to sustain latch, nipple held in mouth throughout feeding, stimulation needed to elicit sucking reflex.  Audible Swallowing: None  Type of Nipple: Everted at rest and after stimulation  Comfort (Breast/Nipple): Soft / non-tender  Hold (Positioning): Assistance needed to correctly position infant at breast and maintain latch.  LATCH Score: 6   Lactation Tools Discussed/Used    Interventions Interventions: Breast feeding basics reviewed;Pace feeding  Discharge Pump: DEBP;Personal  Consult Status Consult Status: Follow-up Date: 03/22/22 Follow-up type: In-patient    [place] 03/21/2022, 7:50 PM

## 2022-03-22 MED ORDER — OXYCODONE HCL 5 MG PO TABS: 5.0000 mg | ORAL_TABLET | ORAL | 0 refills | Status: AC | PRN

## 2022-03-22 MED ORDER — ACETAMINOPHEN 325 MG PO TABS: 650.0000 mg | ORAL_TABLET | Freq: Four times a day (QID) | ORAL | 0 refills | Status: AC | PRN

## 2022-03-22 MED ORDER — IBUPROFEN 600 MG PO TABS: 600.0000 mg | ORAL_TABLET | Freq: Four times a day (QID) | ORAL | 0 refills | Status: AC

## 2022-03-22 NOTE — Lactation Note (Signed)
This note was copied from a baby's chart. Lactation Consultation Note  Patient Name: Vanessa Merritt [id number] Date: 03/22/2022 Reason for consult: Follow-up assessment;Primapara;1st time breastfeeding;Early term 37-38.6wks;Infant weight loss;Other (Comment) (8 % weight loss,) Age:29 hours RN request per NP for consult prior to MRI.  As LC entered the room mom attempting to latch , baby sleepy.  LC recommended and appetizer of Donor milk and then try to latch.  Baby more awake latched for 5-6 strong sucks.  LC recommended due to 8 % weight loss , focus today should be on calories. LC plan:  Feed with feeding cues STS , if baby to sluggish to latch- appetizer of EBM or donor milk 10 ml and then try to latch. ( Don't exhaust the baby trying ) .  Supplement today working up to 30 ml by tonight.  Post pump both breast for 15 -20 mins , save milk.  Next feeding switch and offer the other breast.  Report given to [facility].    Maternal Data Has patient been taught Hand Expression?: Yes Does the patient have breastfeeding experience prior to this delivery?: No  Feeding Mother's Current Feeding Choice: Breast Milk and Donor Milk Nipple Type: Nfant Standard Flow (white)  LATCH Score Latch: Grasps breast easily, tongue down, lips flanged, rhythmical sucking.  Audible Swallowing: None  Type of Nipple: Everted at rest and after stimulation  Comfort (Breast/Nipple): Soft / non-tender  Hold (Positioning): Assistance needed to correctly position infant at breast and maintain latch.  LATCH Score: 7   Lactation Tools Discussed/Used    Interventions Interventions: Breast feeding basics reviewed;Assisted with latch;Skin to skin;Breast massage;Hand express;Reverse pressure;Breast compression;Adjust position;Support pillows;Position options;DEBP;Education  Discharge Pump: DEBP;Personal  Consult Status Consult Status: Follow-up Date: 03/23/22 Follow-up type:  In-patient    Matilde Sprang Tymeer Vaquera 03/22/2022, 9:49 AM

## 2022-03-22 NOTE — Progress Notes (Signed)
Postpartum Progress Note  Postpartum Day 2 s/p primary Cesarean section.  Subjective:  Patient reports no overnight events.  She reports well controlled pain, ambulating without difficulty, voiding spontaneously, tolerating PO.  She reports Negative flatus, Negative BM.  Vaginal bleeding is wnl.  Objective: Blood pressure 120/80, pulse 63, temperature 98.1 F (36.7 C), temperature source Oral, resp. rate 17, height 5\' 7"  (1.702 m), weight 131.7 kg, last menstrual period 04/17/2021, SpO2 100 %, unknown if currently breastfeeding.  Physical Exam:  General: alert and no distress Lochia: appropriate Uterine Fundus: firm Incision: dressing in place DVT Evaluation: No evidence of DVT seen on physical exam.  Recent Labs    03/20/22 0147 03/21/22 0503  HGB 11.4* 11.0*  HCT 33.2* 31.9*    Assessment/Plan: Postpartum Day 2, s/p C-section Replace honeycomb today Lactation following Baby boy - continued workup w/ genetics, neuro.  Planning for MRI head this morning.  Will circ when cleared. Doing well, continue routine postpartum care. Anticipate discharge PPD#2 or #3   LOS: 2 days   03/23/22 03/22/2022, 7:26 AM

## 2022-03-23 NOTE — Discharge Summary (Signed)
Obstetric Discharge Summary  Vanessa Merritt is a 31 y.o. female that presented on 03/20/2022 for IOL A2GDM on glyburide.  Pregnancy has been notable for skeletal abnormalities on obstetric ultrasounds. She was followed by MFM.  She was admitted to labor and delivery for her induction.  Her labor course was complicated by failure to progress and she delivered a viable female infant on 03/20/22 via primary low transverse C section.  Her postpartum course was uncomplicated and on PPD#2, she reported well controlled pain, spontaneous voiding, ambulating without difficulty, and tolerating PO.  She was stable for discharge home on 03/22/22 with plans for in-office follow up.  Hemoglobin  Date Value Ref Range Status  03/21/2022 11.0 (L) 12.0 - 15.0 g/dL Final   HCT  Date Value Ref Range Status  03/21/2022 31.9 (L) 36.0 - 46.0 % Final    Physical Exam:  General: alert and no distress Lochia: appropriate Uterine Fundus: firm Incision: healing well DVT Evaluation: No evidence of DVT seen on physical exam.  Discharge Diagnoses: Term Pregnancy-delivered  Discharge Information: Date: 03/23/2022 Activity: Pelvic rest, as tolerated Diet: routine Medications: Tylenol, motrin, oxycodone Condition: stable Instructions: Refer to practice specific booklet.  Discussed prior to discharge.  Discharge to: Home  Follow-up Information     , [facility] Follow up.   Why: Please follow up for 6 week postpartum visit. Contact information: [address] [place] [address] [phone]                 Newborn Data: Live born female  Birth Weight: 7 lb 10.8 oz (3480 g) APGAR: 8, 9  Newborn Delivery   Birth date/time: 03/20/2022 17:30:00 Delivery type: C-Section, Low Transverse Trial of labor: Yes C-section categorization: Primary      Home with mother.  Lyn Henri 03/23/2022, 4:08 PM

## 2022-03-28 ENCOUNTER — Telehealth (HOSPITAL_COMMUNITY): Payer: Self-pay | Admitting: *Deleted

## 2022-03-28 NOTE — Telephone Encounter (Signed)
Left phone voicemail message.  Odis Hollingshead, RN 03-28-2022 at 3:17pm

## 2022-04-01 ENCOUNTER — Telehealth: Payer: Self-pay | Admitting: Family Medicine

## 2022-04-01 ENCOUNTER — Telehealth (INDEPENDENT_AMBULATORY_CARE_PROVIDER_SITE_OTHER): Payer: BC Managed Care – PPO | Admitting: Family Medicine

## 2022-04-01 DIAGNOSIS — F900 Attention-deficit hyperactivity disorder, predominantly inattentive type: Secondary | ICD-10-CM

## 2022-04-01 MED ORDER — AMPHETAMINE-DEXTROAMPHET ER 10 MG PO CP24: 10.0000 mg | ORAL_CAPSULE | Freq: Every day | ORAL | 0 refills | Status: AC

## 2022-04-01 MED ORDER — AMPHETAMINE-DEXTROAMPHET ER 10 MG PO CP24: 10.0000 mg | ORAL_CAPSULE | Freq: Every day | ORAL | 0 refills | Status: DC

## 2022-04-01 NOTE — Progress Notes (Signed)
I connected with Vanessa Merritt on 04/01/22 at 12:00 PM EDT by video and verified that I am speaking with the correct person using two identifiers.   I discussed the limitations, risks, security and privacy concerns of performing an evaluation and management service by video and the availability of in person appointments. I also discussed with the patient that there may be a patient responsible charge related to this service. The patient expressed understanding and agreed to proceed.  Patient location: Home Provider Location: [place] Participants: Lynnda Child and [name] Stonehocker   Subjective:     Vanessa Merritt is a 31 y.o. female presenting for medication discussion     HPI  Post partum Had her baby on 03/20/2022 Continues to breast feeding  Would like to restart her adhd medication Was on Adderall 25 mg Had spoke with her OB prior to having the baby about restarting post partum  Recommended trying Adderall 10 mg  Is pumping with good supply Infant falling asleep at the breast - had a weight check Friday with good growth   Review of Systems   Social History   Tobacco Use  Smoking Status Never  Smokeless Tobacco Never        Objective:   BP Readings from Last 3 Encounters:  03/22/22 120/80  02/20/22 121/82  01/23/22 130/83   Wt Readings from Last 3 Encounters:  03/20/22 290 lb 4.8 oz (131.7 kg)  09/24/21 258 lb (117 kg)  08/14/21 255 lb 2 oz (115.7 kg)    LMP 04/17/2021 (Exact Date)   Breastfeeding Yes   Physical Exam Constitutional:      Appearance: Normal appearance. She is not ill-appearing.  HENT:     Head: Normocephalic and atraumatic.     Right Ear: External ear normal.     Left Ear: External ear normal.  Eyes:     Conjunctiva/sclera: Conjunctivae normal.  Pulmonary:     Effort: Pulmonary effort is normal. No respiratory distress.  Neurological:     Mental Status: She is alert. Mental status is at baseline.   Psychiatric:        Mood and Affect: Mood normal.        Behavior: Behavior normal.        Thought Content: Thought content normal.        Judgment: Judgment normal.           Assessment & Plan:   Problem List Items Addressed This Visit       Other   Attention deficit hyperactivity disorder (ADHD), predominantly inattentive type - Primary    Postpartum and nursing. Is having trouble focusing. Would like to restart medication. Is still napping with her infant and worried about sleep with long-acting adderall. OB/GYN prescribed Adderall XR 10 mg. Discussed trying lower dose, if trouble with daytime naps - update and can switch to short acting adderall. If ineffective, increase to previous dose of 20 mg. Will plan to adjust as needed. Return 3 months or sooner as needed.        Relevant Medications   amphetamine-dextroamphetamine (ADDERALL XR) 10 MG 24 hr capsule   Breast feeding status of mother    Primarily pumping and keeping track of supply. Discussed adderall can increase milk supply and to if she notices decrease to consider stopping medication for 2-3 weeks before restarting.        Relevant Medications   amphetamine-dextroamphetamine (ADDERALL XR) 10 MG 24 hr capsule  Return in about 3 months (around 07/02/2022) for adhd.  Lynnda Child, MD

## 2022-04-01 NOTE — Patient Instructions (Signed)
Adderall - biggest impact is milk supply - if your supply decreases you can make the choice to stop the medication or continue and supplement with formula as needed  Try the Adderall  long acting 10 mg  If you have trouble napping -- send me a mychart and let me know if you felt the 10 mg helped with symptoms. In which case I can prescribe short acting adderall while you are still sleeping when the baby does.   If you feel the 10 mg is not working. Increase to 2 pills (20 mg).   Update me in a couple of weeks for refills  Return in about 3 months

## 2022-04-01 NOTE — Assessment & Plan Note (Signed)
Primarily pumping and keeping track of supply. Discussed adderall can increase milk supply and to if she notices decrease to consider stopping medication for 2-3 weeks before restarting.

## 2022-04-01 NOTE — Telephone Encounter (Signed)
Sending in medication. Routing to [employer] as FYI for PA.

## 2022-04-01 NOTE — Telephone Encounter (Signed)
Pt called back about appt she just had and she was told to follow up on medication the her obgyn was going to send in. She said her obgyn said they arewnt going to do the PA for the med to get it covered and she want ed to know if Dr Selena Batten would send it in instead and the ob can cancel it, its for 10mg  XR Adderall. She would like if you can to send it in to [facility] on [address] in [place]. Callback for pt is [phone]

## 2022-04-01 NOTE — Assessment & Plan Note (Signed)
Postpartum and nursing. Is having trouble focusing. Would like to restart medication. Is still napping with her infant and worried about sleep with long-acting adderall. OB/GYN prescribed Adderall XR 10 mg. Discussed trying lower dose, if trouble with daytime naps - update and can switch to short acting adderall. If ineffective, increase to previous dose of 20 mg. Will plan to adjust as needed. Return 3 months or sooner as needed.

## 2022-04-02 NOTE — Telephone Encounter (Signed)
Pt aware.

## 2022-04-02 NOTE — Telephone Encounter (Signed)
Prior auth started for Amphetamine-Dextroamphetamine 10MG  tablets. Vanessa Merritt Key: Waiting for determination.

## 2022-04-04 NOTE — Telephone Encounter (Signed)
Pt called back and said she talked to her insurance regarding the PA and they she needed to complete the criteria form and that it was faxed to [place] but if we hadnt received it then Dr Selena Batten could call and give a verbal for it. Please advise

## 2022-04-05 NOTE — Telephone Encounter (Signed)
New prior auth started for Amphetamine-Dextroamphet ER 10MG  er capsules. Miria Narula Key: - PA Case [id number] - Rx #: [id number] Waiting for determination.

## 2022-04-07 NOTE — Telephone Encounter (Signed)
Is there a phone number to call? I'm unclear who I can provide a verbal to

## 2022-04-08 NOTE — Telephone Encounter (Signed)
Prior auth approved for Amphetamine-Dextroamphet ER 10MG  er capsules.  [facility]  received a request from your provider for coverage of AmphetamineDextroamphetamine ER 10mg . As long as you remain covered by the [facility] and there are no changes to your plan benefits, this request is approved for the following time period: 04/05/2022 - 04/05/2025  Sent approval letter to scanning.

## 2022-07-23 ENCOUNTER — Telehealth (INDEPENDENT_AMBULATORY_CARE_PROVIDER_SITE_OTHER): Payer: BC Managed Care – PPO | Admitting: Family Medicine

## 2022-07-23 ENCOUNTER — Encounter: Payer: Self-pay | Admitting: Family Medicine

## 2022-07-23 DIAGNOSIS — L509 Urticaria, unspecified: Secondary | ICD-10-CM | POA: Insufficient documentation

## 2022-07-23 NOTE — Assessment & Plan Note (Signed)
Patient with a history of allergies as well as asthma now presenting with itching and hives.  They do seem to self resolve, clear trigger unclear.  Advised starting Zyrtec in addition to Singulair.  If no improvement after few days she will add famotidine.  She previously had allergy testing done and has no known exposures to any of these allergens, referral to allergy if symptoms do not improve.  She will follow-up if worsening for consideration for additional treatment.

## 2022-07-23 NOTE — Progress Notes (Signed)
I connected with Vanessa Merritt on 07/23/22 at  8:20 AM EDT by video and verified that I am speaking with the correct person using two identifiers.   I discussed the limitations, risks, security and privacy concerns of performing an evaluation and management service by video and the availability of in person appointments. I also discussed with the patient that there may be a patient responsible charge related to this service. The patient expressed understanding and agreed to proceed.  Patient location: Work Provider Location: [place] Participants: Lesleigh Noe and Remo Lipps Go[place]   Subjective:     Vanessa Merritt is a 31 y.o. female presenting for Urticaria     HPI  #Hives - last Thursday  - was at school and earlier that morning she had itching on the thigh - then hand started to itch and noticed some welts - no breathing issues - allergic to cats - not sure if exposure to kid - took benadryl and washed her hands well - got home and broke out on her feet - since then will get intermittent itching for 30 minutes  - it will move along the body - earlier this morning it was her ankles - no change in soaps, lotions, detergents - not sure the cause - just restarted her Singulair for allergies   Has seen the allergist before - dust, cat dander, rabbit  Similar to when she had a rabbit - hands swelling  No breathing issues currently   Gave birth in May  Is breast feeding    Review of Systems   Social History   Tobacco Use  Smoking Status Never  Smokeless Tobacco Never        Objective:   BP Readings from Last 3 Encounters:  03/22/22 120/80  02/20/22 121/82  01/23/22 130/83   Wt Readings from Last 3 Encounters:  03/20/22 290 lb 4.8 oz (131.7 kg)  09/24/21 258 lb (117 kg)  08/14/21 255 lb 2 oz (115.7 kg)    There were no vitals taken for this visit.   Physical Exam Constitutional:      Appearance: Normal appearance. She is  not ill-appearing.  HENT:     Head: Normocephalic and atraumatic.     Right Ear: External ear normal.     Left Ear: External ear normal.  Eyes:     Conjunctiva/sclera: Conjunctivae normal.  Pulmonary:     Effort: Pulmonary effort is normal. No respiratory distress.  Neurological:     Mental Status: She is alert. Mental status is at baseline.  Psychiatric:        Mood and Affect: Mood normal.        Behavior: Behavior normal.        Thought Content: Thought content normal.        Judgment: Judgment normal.         Assessment & Plan:   Problem List Items Addressed This Visit       Musculoskeletal and Integument   Hives - Primary    Patient with a history of allergies as well as asthma now presenting with itching and hives.  They do seem to self resolve, clear trigger unclear.  Advised starting Zyrtec in addition to Singulair.  If no improvement after few days she will add famotidine.  She previously had allergy testing done and has no known exposures to any of these allergens, referral to allergy if symptoms do not improve.  She will follow-up if worsening for  consideration for additional treatment.      Relevant Orders   Ambulatory referral to Allergy     Return if symptoms worsen or fail to improve.  Lynnda Child, MD

## 2022-07-23 NOTE — Patient Instructions (Addendum)
Bobbitt, Sedalia Muta, MD [facility]    Start Daily allergy pill (claritin, zyrtec, allegra) in additional to the Singulair - one at night and one in the morning  If after few days if hives persisting start Famotidine (heartburn medicine) in additional to the allergy pills  If one lesion lasts >24 hours or you are worsening make an in-person follow-up visit

## 2022-10-04 IMAGING — US US MFM OB FOLLOW-UP
1 series · 13 of 28 positions shown · non-contrast
Comparison: none

[Series 1: us mfm ob follow-up · 13 of 51 slices shown]
[im 2/51]
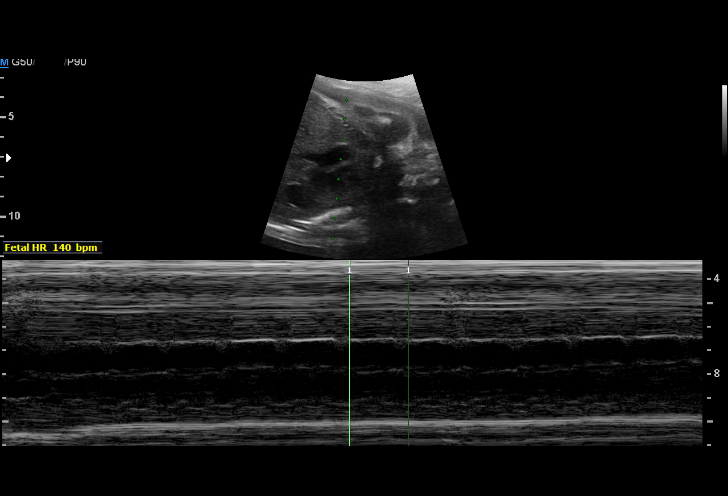
[im 6/51]
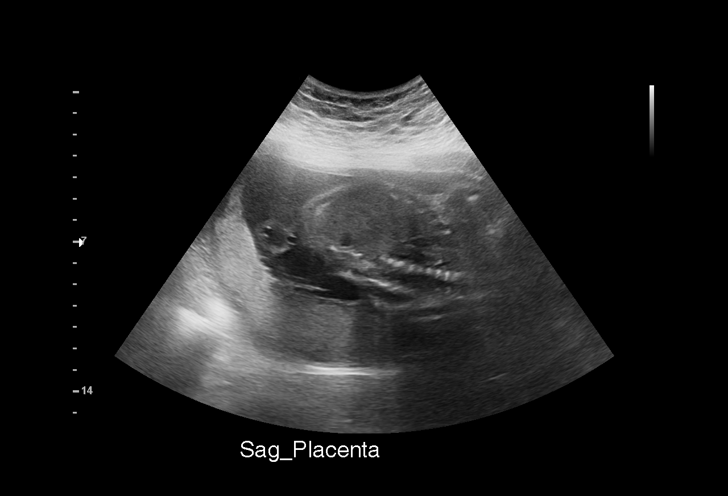
[im 10/51]
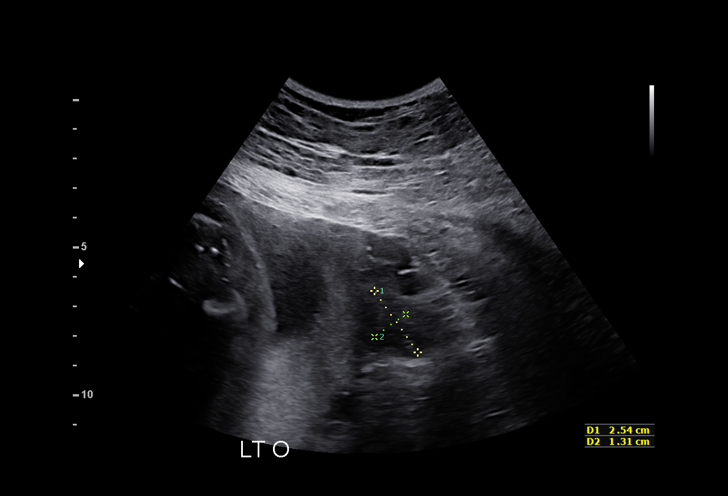
[im 13/51]
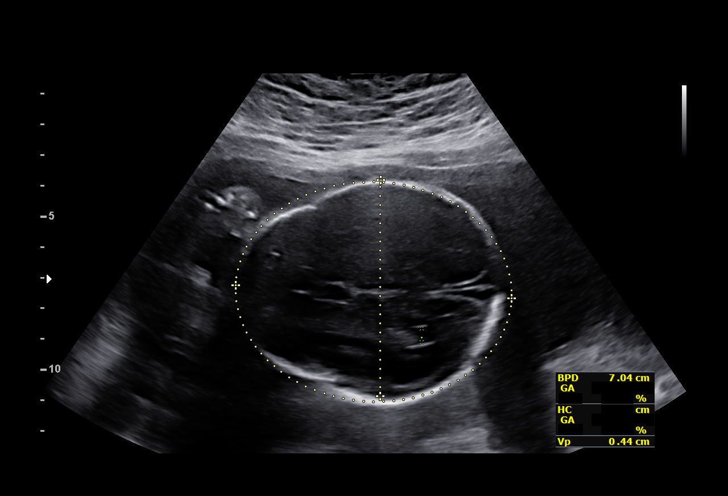
[im 17/51]
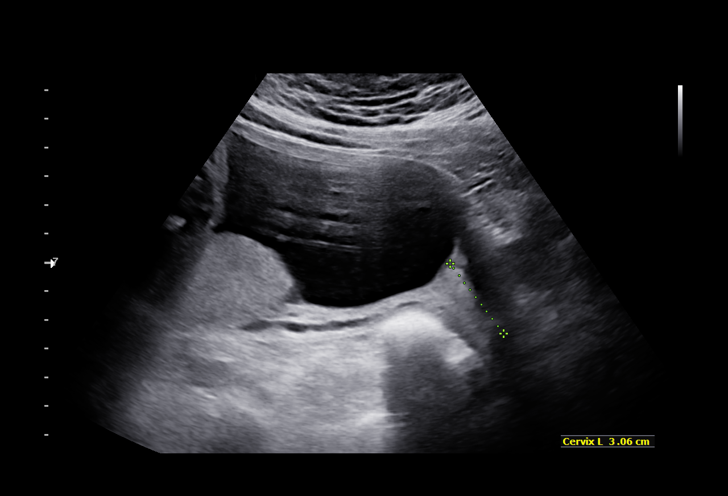
[im 21/51]
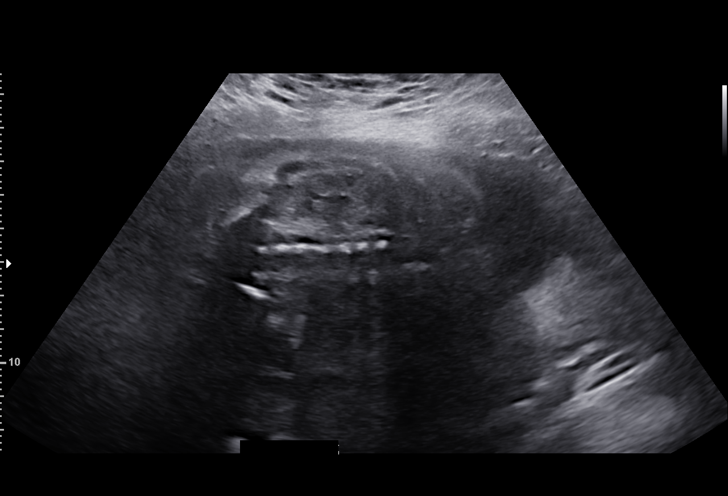
[im 26/51]
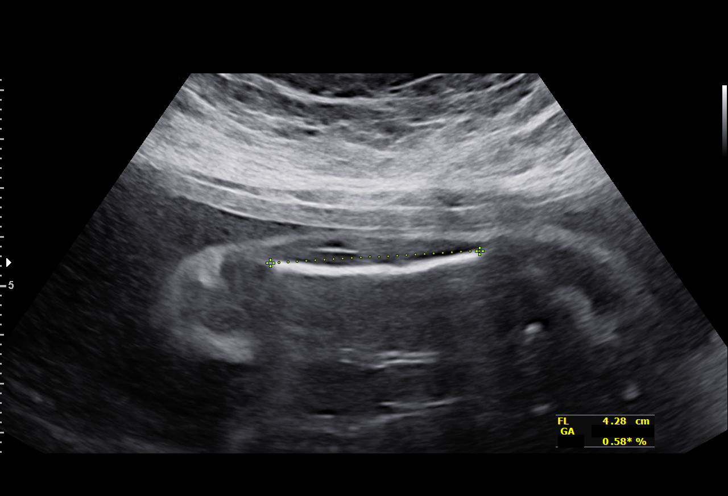
[im 30/51]
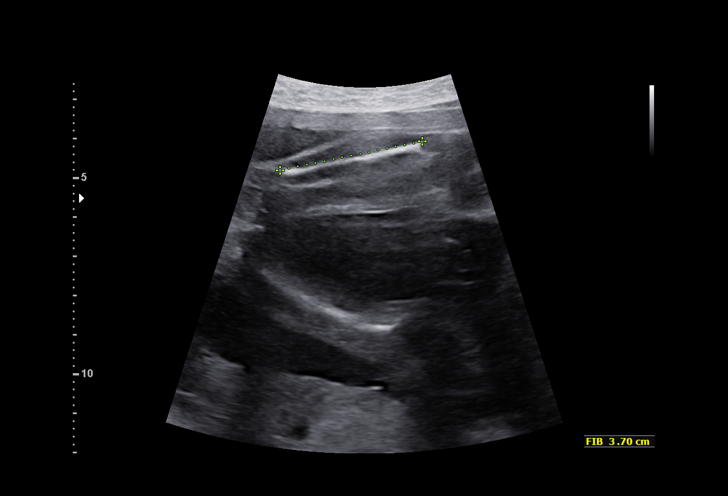
[im 34/51]
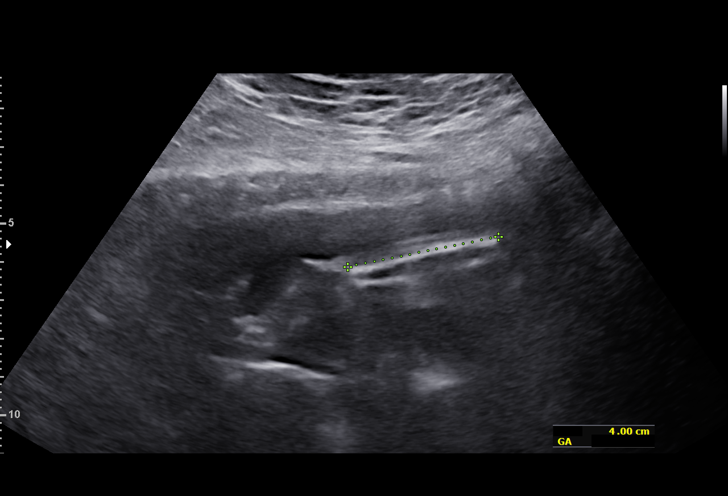
[im 38/51]
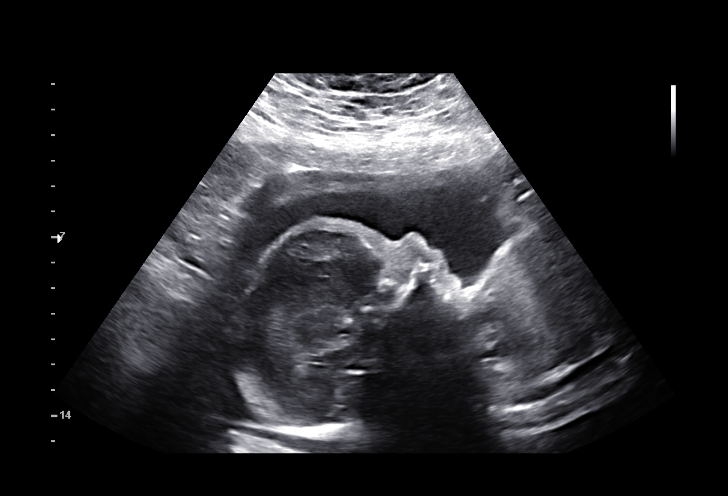
[im 41/51]
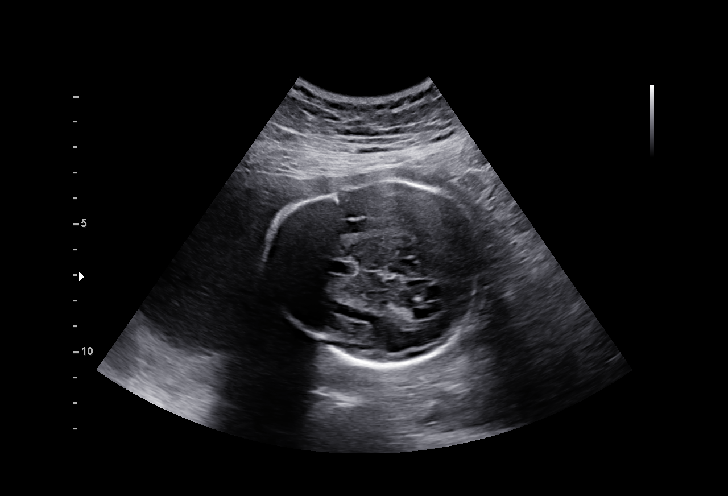
[im 45/51]
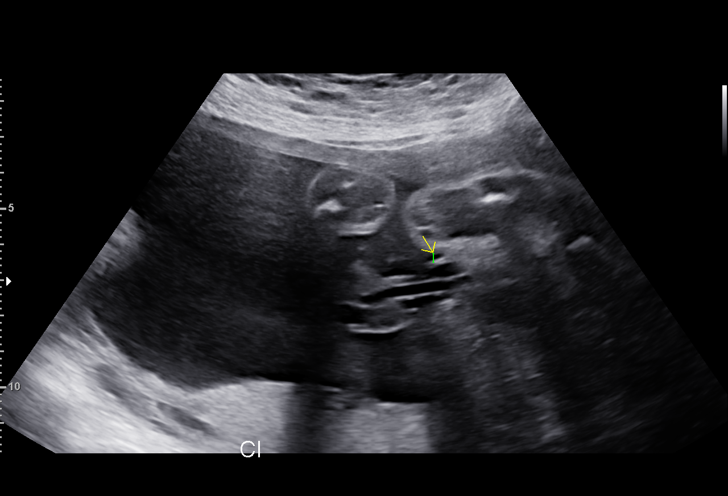
[im 49/51]
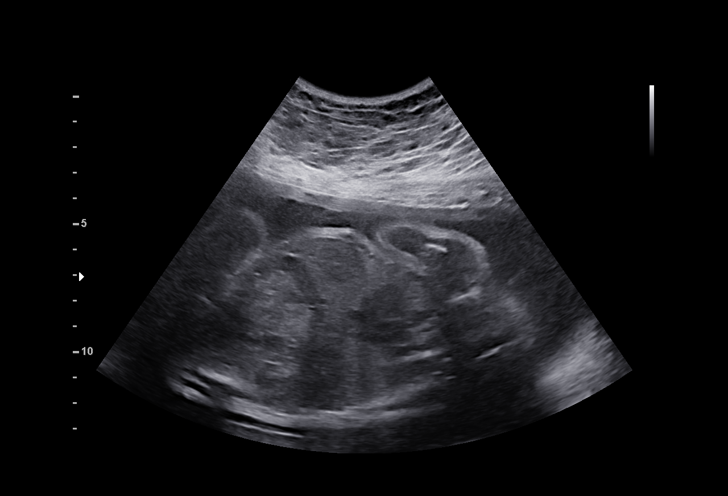

[13 of 28 positions shown; findings below may reference images not displayed]

[REDACTED]

Indications

 Abnormal fetal ultrasound (absent nasal
 bone; r/o craniosynostosis)
 Obesity complicating pregnancy, second
 trimester (BMI 40.87)
 Marginal insertion of umbilical cord affecting
 management of mother in second trimester
 Encounter for antenatal screening for
 malformations
 26 weeks gestation of pregnancy
Fetal Evaluation

 Num Of Fetuses:         1
 Fetal Heart Rate(bpm):  140
 Cardiac Activity:       Observed
 Presentation:           Breech
 Placenta:               Posterior
 P. Cord Insertion:      Previously Visualized

 Amniotic Fluid
 AFI FV:      Within normal limits

                             Largest Pocket(cm)

Biometry

 BPD:      70.9  mm     G. Age:  28w 3d         92  %    CI:        76.62   %    70 - 86
                                                         FL/HC:      17.0   %    18.6 -
 HC:      256.6  mm     G. Age:  27w 6d         68  %    HC/AC:      1.06        1.05 -
 AC:      241.8  mm     G. Age:  28w 3d         90  %    FL/BPD:     61.4   %    71 - 87
 FL:       43.5  mm     G. Age:  24w 2d        1.1  %    FL/AC:      18.0   %    20 - 24
 HUM:      38.6  mm     G. Age:  23w 5d        < 5  %

 LV:        4.4  mm
 ULN:      39.5  mm     G. Age:  25w 6d         20  %
 TIB:      37.5  mm     G. Age:  24w 1d        < 5  %
 RAD:      33.6  mm     G. Age:  23w 6d         18  %
 FIB:      36.2  mm     G. Age:  23w 4d         22  %

 Est. FW:    5252  gm      2 lb 4 oz     55  %
OB History

 Gravidity:    1
 Living:       0
Gestational Age

 U/S Today:     27w 2d                                        EDD:   03/25/22
 Best:          26w 4d     Det. By:  Previous Ultrasound      EDD:   03/30/22
                                     (11/14/21)
Anatomy

 Cranium:               Abnormal               Aortic Arch:            Not well visualized
 Cavum:                 Previously seen        Ductal Arch:            Not well visualized
 Ventricles:            Appears normal         Diaphragm:              Previously seen
 Choroid Plexus:        Previously seen        Stomach:                Appears normal, left
                                                                       sided
 Cerebellum:            Previously seen        Abdomen:                Appears normal
 Posterior Fossa:       Previously seen        Abdominal Wall:         Appears nml (cord
                                                                       insert, abd wall)
 Nuchal Fold:           Not applicable (>20    Cord Vessels:           Previously seen
                        wks GA)
 Face:                  Absent nasal bone      Kidneys:                Appear normal
 Lips:                  Previously seen        Bladder:                Appears normal
 Thoracic:              Previously seen        Spine:                  Not well visualized
 Heart:                 Appears normal         Upper Extremities:      Previously seen
                        (4CH, axis, and
                        situs)
 RVOT:                  Previously seen        Lower Extremities:      Previously seen
 LVOT:                  Not well visualized

 Other:  3VTV visualized. Heels visualized. Technically difficult due to
         maternal habitus. Male fetus.
Cervix Uterus Adnexa

 Cervix
 Length:           3.06  cm.
 Normal appearance by transabdominal scan.
 Right Ovary
 Visualized.

 Left Ovary
 Visualized.
Impression

 Patient returned for completion of fetal anatomy. She was
 accompanied by her husband. At her previous ultrasound,
 isolated craniosynostosis was suspected because of
 abnormal shape of the head.
 On today's ultrasound, amniotic fluid is normal and good fetal
 activity is seen. Fetal growth is appropriate for gestational
 age. Head circumference appears normal (no evidence of
 macrocephaly).
 Nasal bone is absent.
 Abnormal cranial contour with slight dolicocephaly, mild
 frontal bossing are seen. Temporal indentation is seen.
 Femur, humeral and tibial measurements are at less than the
 5th percentile. No bowing of long bones is seen. Open hands
 could not be assessed today.
 Intracranial structures appear normal. Fetal anatomical
 survey still could not be completed because of fetal position.
 I discussed the findings and counseled the couple that a firm
 diagnosis cannot be made on the findings seen. Diagnosis
 may be possible in the third trimester in some cases.
 Differential diagnosis includes achondroplasia (less likely).
 I discussed the role of amniocentesis to rule [DATE]
 mutations that may be involved in many skeletal conditions.
 Patient will discuss at home and call us back if she opts for
 amniocentesis. I offered to make an appointment with our
 genetic counselor.
 I informed the couple of the likelihood of a normal (variant)
 fetal head.
Recommendations

 -An appointment was made for her to return in 4 weeks for
 fetal growth assessment.
                 Bodiba, Lucky Mosinga

## 2022-11-29 IMAGING — US US MFM OB FOLLOW-UP
1 series · 12 of 28 positions shown · non-contrast
Comparison: none

[Series 1: us mfm ob follow-up · 73 acquisitions, 12 frames shown]
[im 3/73]
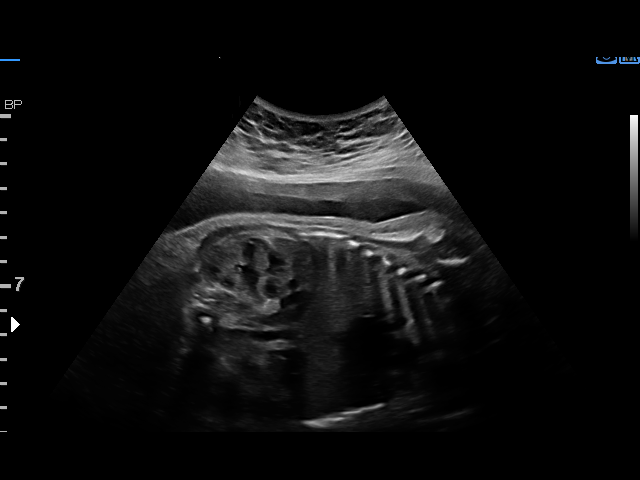
[im 9/73]
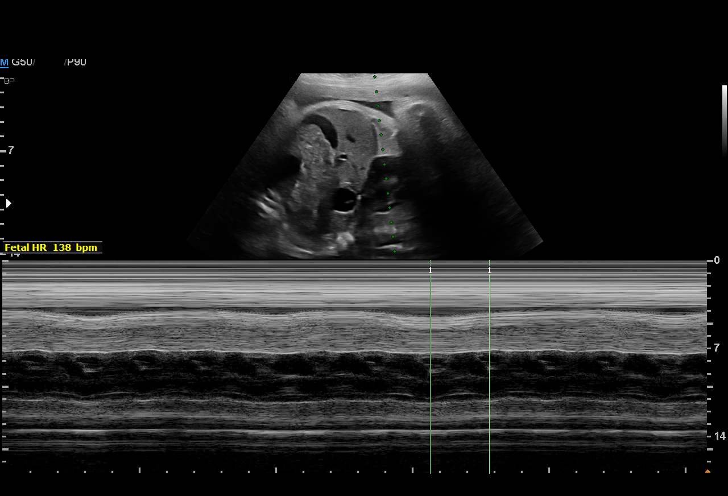
[im 14/73]
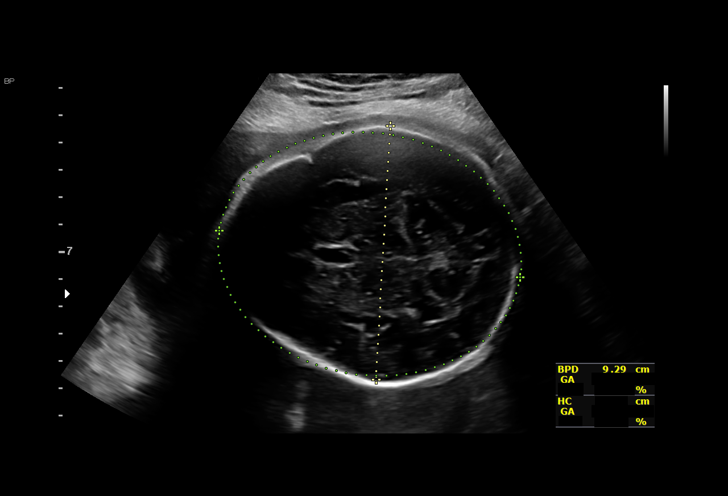
[im 22/73]
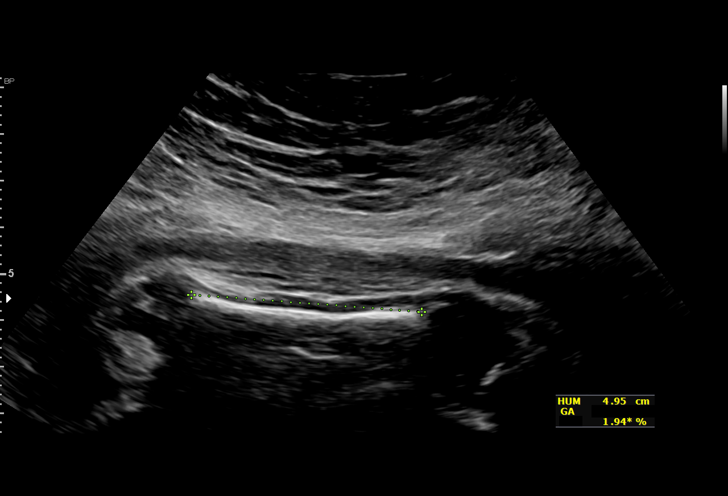
[im 27/73]
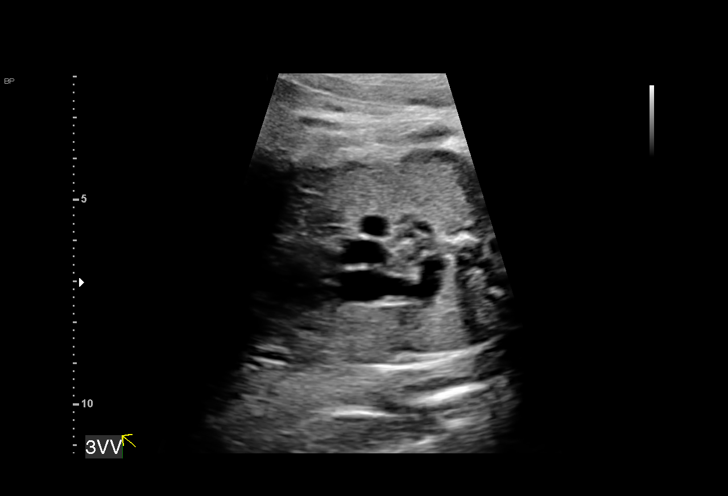
[im 33/73]
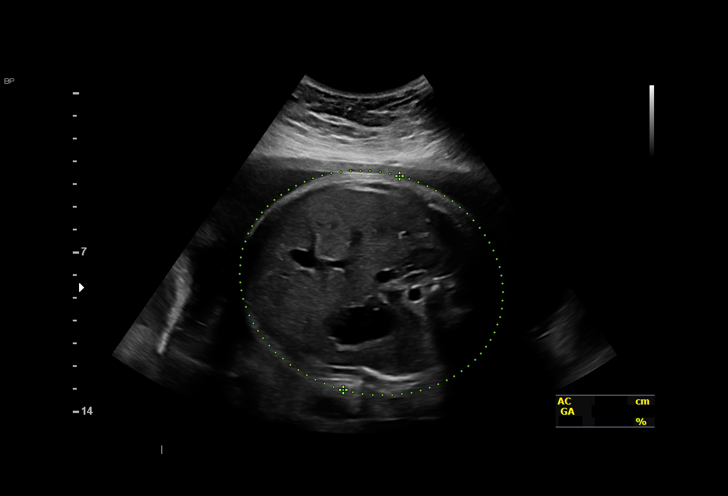
[im 41/73]
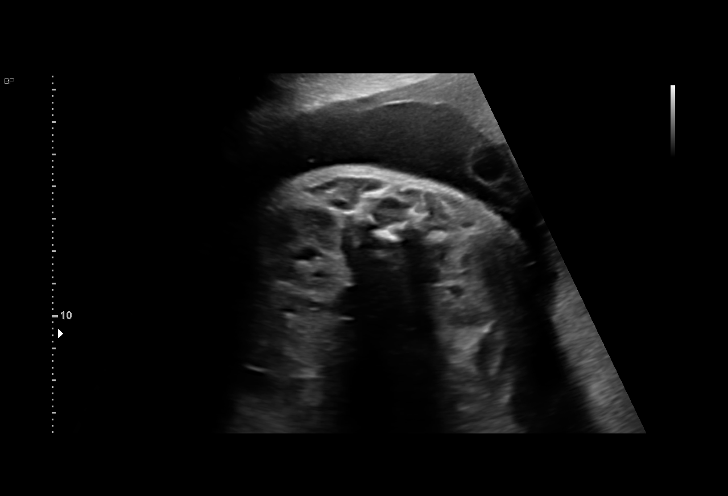
[im 46/73]
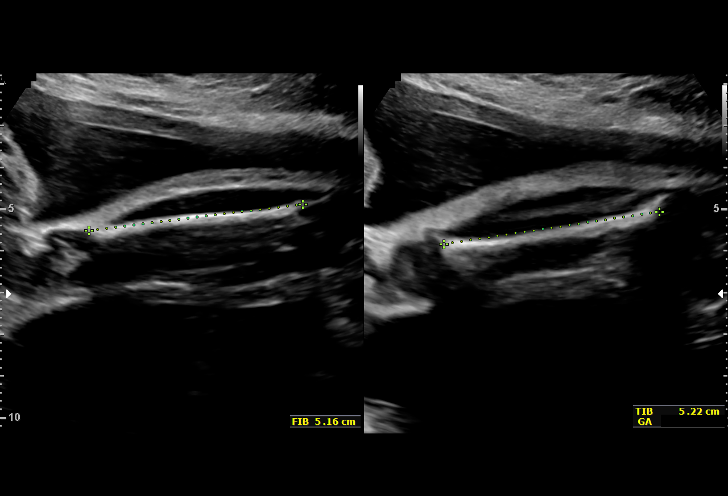
[im 51/73]
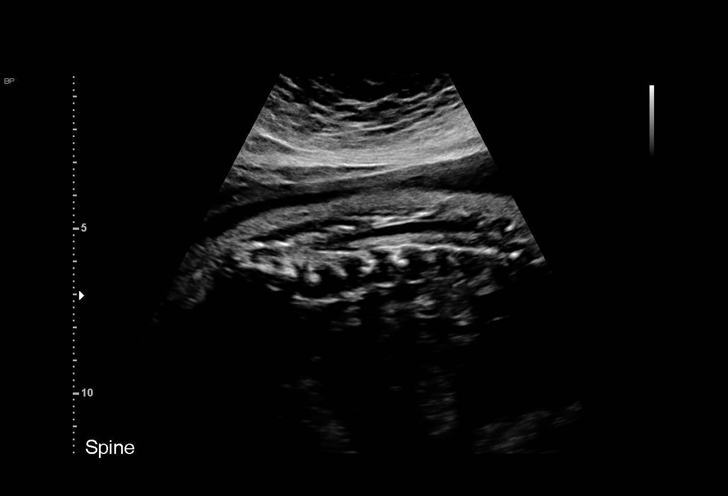
[im 59/73]
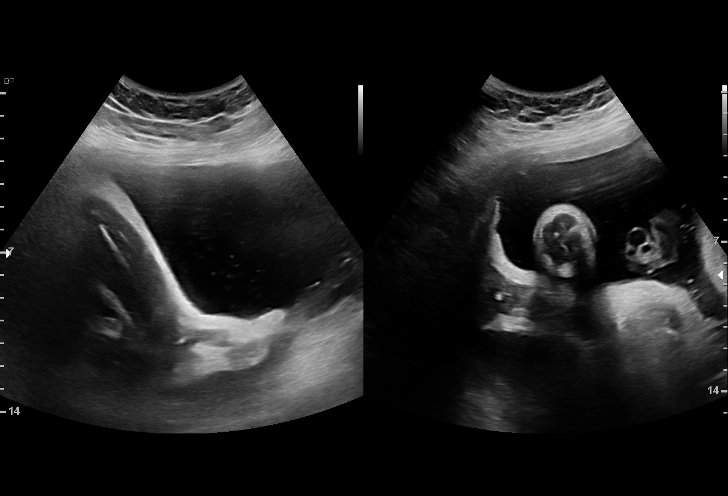
[im 65/73]
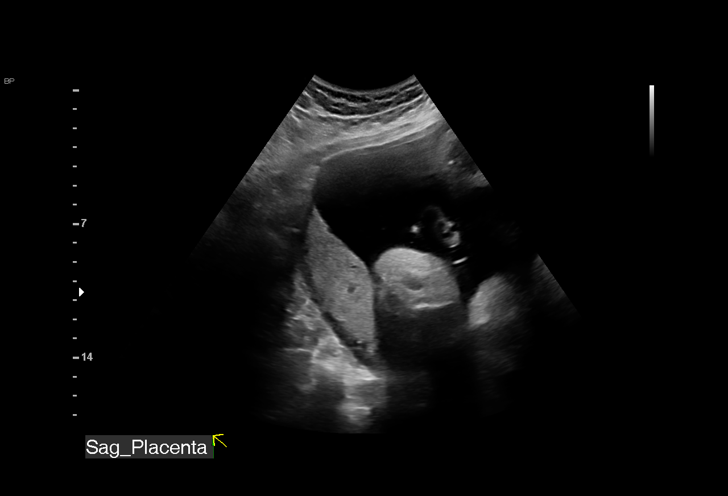
[im 70/73]
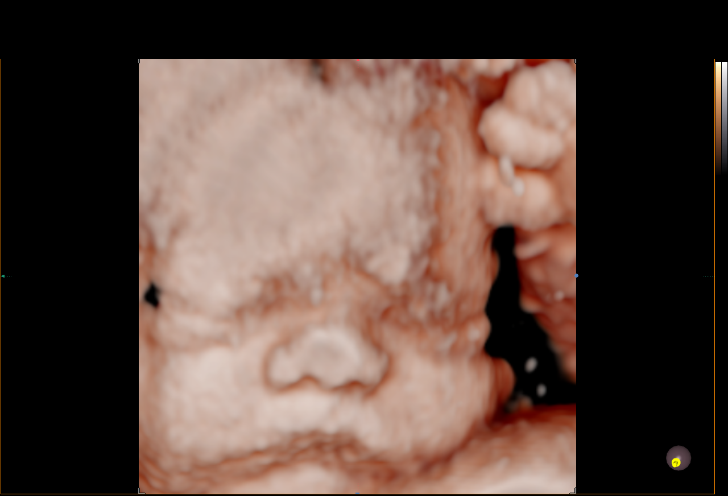

[12 of 28 positions shown; findings below may reference images not displayed]

[REDACTED]

Indications

 Gestational diabetes in pregnancy,
 controlled by oral hypoglycemic drugs
 Abnormal fetal ultrasound (absent nasal
 bone; r/o craniosynostosis)
 Obesity complicating pregnancy, third
 trimester (BMI 40.87)
 Marginal insertion of umbilical cord affecting
 management of mother in third trimester
 34 weeks gestation of pregnancy
Fetal Evaluation

 Num Of Fetuses:         1
 Fetal Heart Rate(bpm):  138
 Cardiac Activity:       Observed
 Presentation:           Cephalic
 Placenta:               Posterior
 P. Cord Insertion:      Marginal insertion prev vis

 Amniotic Fluid
 AFI FV:      Within normal limits

 AFI Sum(cm)     %Tile       Largest Pocket(cm)
 17.28           63
 RUQ(cm)       RLQ(cm)       LUQ(cm)        LLQ(cm)

Biophysical Evaluation

 Amniotic F.V:   Pocket => 2 cm             F. Tone:        Observed
 F. Movement:    Observed                   Score:          [DATE]
 F. Breathing:   Observed
Biometry

 BPD:      93.1  mm     G. Age:  37w 6d       > 99  %    CI:        85.66   %    70 - 86
                                                         FL/HC:      18.8   %    20.1 -
 HC:      316.8  mm     G. Age:  35w 4d         40  %    HC/AC:      0.94        0.93 -
 AC:      335.3  mm     G. Age:  37w 3d         99  %    FL/BPD:     64.1   %    71 - 87
 FL:       59.7  mm     G. Age:  31w 1d        < 1  %    FL/AC:      17.8   %    20 - 24
 HUM:      49.3  mm     G. Age:  29w 0d        < 5  %
 LV:        4.4  mm
 ULN:      51.1  mm     G. Age:  32w 1d        < 5  %
 TIB:      52.2  mm     G. Age:  31w 0d        < 5  %
 RAD:      44.4  mm     G. Age:  31w 3d         34  %
 FIB:      51.6  mm     G. Age:  31w 4d         34  %

 Est. FW:    0677  gm      6 lb 1 oz     79  %
OB History

 Gravidity:    1
 Living:       0
Gestational Age

 U/S Today:     35w 4d                                        EDD:   03/23/22
 Best:          34w 4d     Det. By:  Previous Ultrasound      EDD:   03/30/22
                                     (11/14/21)
Anatomy

 Cranium:               Abnormal               Aortic Arch:            Not well visualized
 Cavum:                 Appears normal         Ductal Arch:            Not well visualized
 Ventricles:            Appears normal         Diaphragm:              Appears normal
 Choroid Plexus:        Previously seen        Stomach:                Appears normal, left
                                                                       sided
 Cerebellum:            Previously seen        Abdomen:                Previously seen
 Posterior Fossa:       Previously seen        Abdominal Wall:         Previously seen
 Nuchal Fold:           Not applicable (>20    Cord Vessels:           Appears normal (3
                        wks GA)                                        vessel cord)
 Face:                  Absent nasal bone      Kidneys:                Appear normal
 Lips:                  Appears normal         Bladder:                Appears normal
 Thoracic:              Previously seen        Spine:                  Appears normal
 Heart:                 Appears normal         Upper Extremities:      Previously seen
                        (4CH, axis, and
                        situs)
 RVOT:                  Appears normal         Lower Extremities:      Previously seen
 LVOT:                  Appears normal

 Other:  3VV and 3VTV visualized. Heels and hands visualized. VC, 3VTV,
         3VV, heels previously visualized. Male gender previously seen.
         Technically difficult due to maternal habitus.
Cervix Uterus Adnexa

 Cervix
 Not visualized (advanced GA >30wks)

 Uterus
 Normal shape and size.

 Right Ovary
 Not visualized.

 Left Ovary
 Not visualized.
Comments

 Nomasibulele Moatshe was seen for a follow up growth scan due to
 maternal obesity and gestational diabetes treated with
 glyburide.  A large fetal head and abdomen along with a
 shortened femur were noted on her prior ultrasound exams,
 increasing the suspicion for either craniosynostosis or
 achondroplasia.  She denies any problems since her last
 exam.
 She was informed that the fetal growth and amniotic fluid
 level appears appropriate for her gestational age.  The fetal
 head and abdomen measurements continue to measure
 large for her gestational age today, while the femur length
 remains short.
 A BPP performed today was [DATE].
 The patient was advised to have her baby examined after
 birth to determine if achondroplasia or craniosynostosis is
 present.
 Due to maternal obesity and gestational diabetes, she is
 already undergoing twice-weekly fetal testing in your office.
 She should continue fetal testing until delivery.
 Due to the large size of the fetal head and gestational
 diabetes, to increase her chances of a successful vaginal
 delivery, an induction of labor may be considered at around
 38 weeks.
 A vaginal delivery may be attempted even if the fetus has
 craniosynostosis.  However due to the potential for an
 abnormal cranial contour, a vacuum-assisted vaginal delivery
 should be avoided.
 No further exams were scheduled in our office.
 The patient stated that all of her questions were answered
 today.
 A total of 30 minutes was spent counseling and coordinating
 the care for this patient.  Greater than 50% of the time was
 spent in direct face-to-face contact.

## 2023-07-15 ENCOUNTER — Inpatient Hospital Stay (HOSPITAL_COMMUNITY): Payer: BC Managed Care – PPO

## 2023-07-15 ENCOUNTER — Encounter (HOSPITAL_COMMUNITY): Payer: Self-pay | Admitting: *Deleted

## 2023-07-15 ENCOUNTER — Inpatient Hospital Stay (HOSPITAL_COMMUNITY)
Admission: AD | Admit: 2023-07-15 | Discharge: 2023-07-15 | Disposition: A | Discharge status: Home / Self Care | Payer: BC Managed Care – PPO | Attending: Obstetrics and Gynecology | Admitting: Obstetrics and Gynecology

## 2023-07-15 DIAGNOSIS — O021 Missed abortion: Secondary | ICD-10-CM | POA: Diagnosis present

## 2023-07-15 DIAGNOSIS — O209 Hemorrhage in early pregnancy, unspecified: Secondary | ICD-10-CM

## 2023-07-15 DIAGNOSIS — Z3A08 8 weeks gestation of pregnancy: Secondary | ICD-10-CM | POA: Diagnosis not present

## 2023-07-15 LAB — CBC
HCT: 36.6 % (ref 36.0–46.0)
Hemoglobin: 12.1 g/dL (ref 12.0–15.0)
MCH: 27.7 pg (ref 26.0–34.0)
MCHC: 33.1 g/dL (ref 30.0–36.0)
MCV: 83.8 fL (ref 80.0–100.0)
Platelets: 334 10*3/uL (ref 150–400)
RBC: 4.37 MIL/uL (ref 3.87–5.11)
RDW: 12.5 % (ref 11.5–15.5)
WBC: 11.2 10*3/uL — ABNORMAL HIGH (ref 4.0–10.5)
nRBC: 0 % (ref 0.0–0.2)

## 2023-07-15 LAB — WET PREP, GENITAL
Clue Cells Wet Prep HPF POC: NONE SEEN
Sperm: NONE SEEN
Trich, Wet Prep: NONE SEEN
WBC, Wet Prep HPF POC: <10 (ref <10)
Yeast Wet Prep HPF POC: NONE SEEN

## 2023-07-15 LAB — HCG, QUANTITATIVE, PREGNANCY: hCG, Beta Chain, Quant, S: 22726 m[IU]/mL — ABNORMAL HIGH (ref <5)

## 2023-07-15 LAB — POCT PREGNANCY, URINE: Preg Test, Ur: POSITIVE — AB

## 2023-07-15 LAB — ABO/RH: ABO/RH(D): A POS

## 2023-07-15 MED ORDER — ONDANSETRON 4 MG PO TBDP
4.0000 mg | ORAL_TABLET | Freq: Four times a day (QID) | ORAL | 0 refills | Status: AC | PRN
Start: 2023-07-15 — End: ?

## 2023-07-15 MED ORDER — MISOPROSTOL 200 MCG PO TABS
800.0000 ug | ORAL_TABLET | Freq: Once | ORAL | 0 refills | Status: AC
Start: 2023-07-15 — End: 2023-07-15

## 2023-07-15 MED ORDER — OXYCODONE-ACETAMINOPHEN 5-325 MG PO TABS
1.0000 | ORAL_TABLET | Freq: Four times a day (QID) | ORAL | 0 refills | Status: AC | PRN
Start: 2023-07-15 — End: ?

## 2023-07-15 NOTE — Discharge Instructions (Signed)
FACTS YOU SHOULD KNOW  WHAT IS AN EARLY PREGNANCY FAILURE? Once the egg is fertilized with the sperm and begins to develop, it attaches to the lining of the uterus. This early pregnancy tissue may not develop into an embryo (the beginning stage of a baby). Sometimes an embryo does develop but does not continue to grow. These problems can be seen on ultrasound.   MANAGEMNT OF EARLY PREGNANCY FAILURE: About 4 out of 100 (0.25%) women will have a pregnancy loss in her lifetime.  One in five pregnancies is found to be an early pregnancy failure.  There are 3 ways to care for an early pregnancy failure:   Surgery, (2) Medicine, (3) Waiting for you to pass the pregnancy on your own. The decision as to how to proceed after being diagnosed with and early pregnancy failure is an individual one.  The decision can be made only after appropriate counseling.  You need to weigh the pros and cons of the 3 choices. Then you can make the choice that works for you. SURGERY (D&E) Procedure over in 1 day Requires being put to sleep Bleeding may be light Possible problems during surgery, including injury to womb(uterus) Care provider has more control Medicine (CYTOTEC) The complete procedure may take days to weeks No Surgery Bleeding may be heavy at times There may be drug side effects Patient has more control Waiting You may choose to wait, in which case your own body may complete the passing of the abnormal early pregnancy on its own in about 2-4 weeks Your bleeding may be heavy at times There is a small possibility that you may need surgery if the bleeding is too much or not all of the pregnancy has passed. CYTOTEC MANAGEMENT Prostaglandins (cytotec) are the most widely used drug for this purpose. They cause the uterus to cramp and contract. You will place the medicine yourself inside your vagina in the privacy of your home. Empting of the uterus should occur within 3 days but the process may continue for  several weeks. The bleeding may seem heavy at times. POSSIBLE SIDE EFFECTS FROM CYTOTEC Nausea   Vomiting Diarrhea Fever Chills  Hot Flashes Side effects  from the process of the early pregnancy failure include: Cramping  Bleeding Headaches  Dizziness RISKS: This is a low risk procedure. Less than 1 in 100 women has a complication. An incomplete passage of the early pregnancy may occur. Also, Hemorrhage (heavy bleeding) could happen.  Rarely the pregnancy will not be passed completely. Excessively heavy bleeding may occur.  Your doctor may need to perform surgery to empty the uterus (D&E). Afterwards: Everybody will feel differently after the early pregnancy completion. You may have soreness or cramps for a day or two. You may have soreness or cramps for day or two.  You may have light bleeding for up to 2 weeks. You may be as active as you feel like being. If you have any of the following problems you may call Maternity Admissions Unit at [phone]. If you have pain that does not get better  with pain medication Bleeding that soaks through 2 thick full-sized sanitary pads in an hour Cramps that last longer than 2 days Foul smelling discharge Fever above 100.4 degrees F Even if you do not have any of these symptoms, you should have a follow-up exam to make sure you are healing properly. This appointment will be made for you before you leave the hospital. Your next normal period will start again in 4-6  week after the loss. You can get pregnant soon after the loss, so use birth control right away. Finally: Make sure all your questions are answered before during and after any procedure. Follow up with medical care and family planning methods.

## 2023-07-15 NOTE — Progress Notes (Signed)
Wynelle Bourgeois CNM in Family Rm to discuss test results and d/c plan with pt. Written and verbal d/c instructions given by CNM and understanding voiced.

## 2023-07-15 NOTE — [facility] Note (Addendum)
Vanessa Merritt is a 32 y.o. at [redacted]w[redacted]d here in [facility]U reporting: last wk, started with intermittent cramping and some spotting.  Had OB appt on Thursday, viable IUP- edc was adjusted.  Cramping and spotting continued.  Today about 1500, the cramps were more intense, passed a blood clot, nickel sized.  Since then, the cramping has increased and the blood is bright red, less since passed clot. LMP: 7/15 Onset of complaint: 1500 Pain score: 7 Vitals:   07/15/23 1853  BP: 135/80  Pulse: 75  Resp: 17  Temp: 97.9 F (36.6 C)  SpO2: 100%      Lab orders placed from triage:

## 2023-07-15 NOTE — MAU Provider Note (Signed)
Chief Complaint: Vaginal Bleeding and Abdominal Pain   Seen by provider at 2145      SUBJECTIVE HPI: Vanessa Merritt is a 32 y.o. G2P1001 at [redacted]w[redacted]d by LMP who presents to maternity admissions reporting bleeding and cramping which got worse today.  Has not had any testing or [place] yet for this pregnancy. She denies urinary symptoms, h/a, dizziness, n/v, or fever/chills.     Vaginal Bleeding The patient's primary symptoms include pelvic pain and vaginal bleeding. This is a new problem. She is pregnant. Associated symptoms include abdominal pain. Pertinent negatives include no chills, diarrhea, flank pain or headaches. The vaginal discharge was bloody. She has not been passing clots. She has not been passing tissue. Nothing aggravates the symptoms. She has tried nothing for the symptoms.  Abdominal Pain The quality of the pain is cramping. The abdominal pain does not radiate. Pertinent negatives include no diarrhea or headaches. Nothing aggravates the pain. The pain is relieved by Nothing. She has tried nothing for the symptoms.   RN Note:  Vanessa Merritt is a 32 y.o. at [redacted]w[redacted]d here in MAU reporting: last wk, started with intermittent cramping and some spotting.  Had OB appt on Thursday, viable IUP- edc was adjusted.  Cramping and spotting continued.  Today about 1500, the cramps were more intense, passed a blood clot, nickel sized.  Since then, the cramping has increased and the blood is bright red, less since passed clot. LMP: 7/15 Onset of complaint: 1500 Pain score: 7    Past Medical History:  Diagnosis Date   ADD (attention deficit disorder)    Allergic rhinitis    Back pain    Childhood asthma    Gestational diabetes    Past Surgical History:  Procedure Laterality Date   CESAREAN SECTION N/A 03/20/2022   Procedure: CESAREAN SECTION;  Surgeon: Marcelle Overlie, MD;  Location: [facility];  Service: Obstetrics;  Laterality: N/A;   WISDOM TOOTH EXTRACTION     Social History    Socioeconomic History   Marital status: Married    Spouse name: Reuel Boom   Number of children: 0   Years of education: bachelors   Highest education level: Not on file  Occupational History    Employer: STUDENT   Occupation: Magazine features editor: [employer]    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  Tobacco Use   Smoking status: Never   Smokeless tobacco: Never  Vaping Use   Vaping status: Never Used  Substance and Sexual Activity   Alcohol use: Not Currently    Alcohol/week: 2.0 standard drinks of alcohol    Types: 1 Glasses of wine, 1 Cans of beer per week    Comment: not while preg   Drug use: No   Sexual activity: Yes    Birth control/protection: None  Other Topics Concern   Not on file  Social History Narrative   01/22/21   From: the area   Living: with parents   Work: Runner, broadcasting/film/video - [employer]      Family: good relationship with parents      Enjoys: Clinical cytogeneticist, wood burning, Architect      Exercise: walking with the dog (pit pull)    Diet: low sugar/low carb diet       Safety   Seat belts: Yes    Guns: Yes  and secure   Safe in relationships: Yes    Social Determinants of Health   Financial Resource Strain: Not on file  Food Insecurity: No Food Insecurity (01/21/2022)   Hunger Vital Sign    Worried About Running Out of Food in the Last Year: Never true    Ran Out of Food in the Last Year: Never true  Transportation Needs: Not on file  Physical Activity: Not on file  Stress: Not on file  Social Connections: Not on file  Intimate Partner Violence: Not on file   No current facility-administered medications on file prior to encounter.   Current Outpatient Medications on File Prior to Encounter  Medication Sig Dispense Refill   acetaminophen (TYLENOL) 325 MG tablet Take 2 tablets (650 mg total) by mouth every 6 (six) hours as needed. 30 tablet 0   albuterol (VENTOLIN HFA) 108 (90 Base) MCG/ACT inhaler Inhale 2 puffs into the lungs every 6  (six) hours as needed for wheezing or shortness of breath. 18 g 1   amphetamine-dextroamphetamine (ADDERALL XR) 10 MG 24 hr capsule Take 1 capsule (10 mg total) by mouth daily. 30 capsule 0   diphenhydrAMINE (BENADRYL) 25 MG tablet Take 25 mg by mouth every 6 (six) hours as needed.     fluticasone (FLONASE) 50 MCG/ACT nasal spray SPRAY 1 SPRAY INTO BOTH NOSTRILS DAILY. 48 mL 1   ibuprofen (ADVIL) 600 MG tablet Take 1 tablet (600 mg total) by mouth every 6 (six) hours. 30 tablet 0   montelukast (SINGULAIR) 10 MG tablet TAKE 1 TABLET BY MOUTH EVERYDAY AT BEDTIME 90 tablet 3   oxyCODONE (OXY IR/ROXICODONE) 5 MG immediate release tablet Take 1-2 tablets (5-10 mg total) by mouth every 4 (four) hours as needed for moderate pain. 18 tablet 0   Prenatal Vit-Fe Fumarate-FA (MULTIVITAMIN-PRENATAL) 27-0.8 MG TABS tablet Take 1 tablet by mouth daily at 12 noon.     No Known Allergies  I have reviewed patient's Past Medical Hx, Surgical Hx, Family Hx, Social Hx, medications and allergies.   ROS:  Review of Systems  Constitutional:  Negative for chills.  Gastrointestinal:  Positive for abdominal pain. Negative for diarrhea.  Genitourinary:  Positive for pelvic pain and vaginal bleeding. Negative for flank pain.  Neurological:  Negative for headaches.   Review of Systems  Other systems negative   Physical Exam  Physical Exam Patient Vitals for the past 24 hrs:  BP Temp Temp src Pulse Resp SpO2 Height Weight  07/15/23 1853 135/80 97.9 F (36.6 C) Oral 75 17 100 % 5\' 7"  (1.702 m) 108.5 kg   Constitutional: Well-developed, well-nourished female in no acute distress.  Cardiovascular: normal rate Respiratory: normal effort GI: Abd soft, non-tender. MS: Extremities nontender, no edema, normal ROM Neurologic: Alert and oriented x 4.  GU: Neg CVAT.  PELVIC EXAM: deferred in lieu of transvaginal ultrasound   LAB RESULTS Results for orders placed or performed during the hospital encounter of  07/15/23 (from the past 24 hour(s))  Pregnancy, urine POC     Status: Abnormal   Collection Time: 07/15/23  6:58 PM  Result Value Ref Range   Preg Test, Ur POSITIVE (A) NEGATIVE  ABO/Rh     Status: None   Collection Time: 07/15/23  7:19 PM  Result Value Ref Range   ABO/RH(D)      A POS Performed at [facility], [address], [place], [place] [address]   CBC     Status: Abnormal   Collection Time: 07/15/23  7:21 PM  Result Value Ref Range   WBC 11.2 (H) 4.0 - 10.5 K/uL   RBC 4.37 3.87 -  5.11 MIL/uL   Hemoglobin 12.1 12.0 - 15.0 g/dL   HCT 76.2 83.1 - 51.7 %   MCV 83.8 80.0 - 100.0 fL   MCH 27.7 26.0 - 34.0 pg   MCHC 33.1 30.0 - 36.0 g/dL   RDW 61.6 07.3 - 71.0 %   Platelets 334 150 - 400 K/uL   nRBC 0.0 0.0 - 0.2 %  hCG, quantitative, pregnancy     Status: Abnormal   Collection Time: 07/15/23  7:21 PM  Result Value Ref Range   hCG, Beta Chain, Quant, S 22,726 (H) <5 mIU/mL  Wet prep, genital     Status: None   Collection Time: 07/15/23  7:27 PM   Specimen: Vaginal  Result Value Ref Range   Yeast Wet Prep HPF POC NONE SEEN NONE SEEN   Trich, Wet Prep NONE SEEN NONE SEEN   Clue Cells Wet Prep HPF POC NONE SEEN NONE SEEN   WBC, Wet Prep HPF POC <10 <10   Sperm NONE SEEN     --/--/A POS Performed at [facility], [address] [place], [place] [address]  [phone])  IMAGING US OB Transvaginal  Result Date: 07/15/2023 CLINICAL DATA:  [id number] Vaginal bleeding during pregnancy [id number] EXAM: TRANSVAGINAL OB ULTRASOUND TECHNIQUE: Transvaginal ultrasound was performed for complete evaluation of the gestation as well as the maternal uterus, adnexal regions, and pelvic cul-de-sac. COMPARISON:  None Available. FINDINGS: Intrauterine gestational sac: Single, positioned within the lower uterine segment and endocervical canal. Yolk sac:  Visualized. Embryo:  Visualized. Cardiac Activity: Not Visualized. Heart Rate: 0 bpm CRL:   18.0 mm   8 w 2 d Subchorionic  hemorrhage:  None visualized. Maternal uterus/adnexae: No adnexal abnormality.  No free fluid. IMPRESSION: Intrauterine gestational sac containing embryo with no cardiac activity. The gestational sac is positioned within the lower uterine segment and endocervical canal. Findings meet definitive criteria for failed pregnancy. This follows SRU consensus guidelines: Diagnostic Criteria for Nonviable Pregnancy Early in the First Trimester. Macy Mis J Med [id number]. Electronically Signed   By: Duanne Guess D.O.   On: 07/15/2023 20:32    MAU Management/MDM: I have reviewed the triage vital signs and the nursing notes.   Pertinent labs & imaging results that were available during my care of the patient were reviewed by me and considered in my medical decision making (see chart for details).      I have reviewed her medical records including past results, notes and treatments. Medical, Surgical, and family history were reviewed.  Medications and recent lab tests were reviewed  Ordered usual first trimester r/o ectopic labs.   Pelvic cultures done Will check baseline Ultrasound to rule out ectopic.  Discussed findings of incomplete abortion.  Discussed options of expectant management, cytotec or D&C.  She elects to try Cytotec.  Discussed process and expectations of pain and bleeding  Consult Dr  Henderson Cloud with presentation, exam findings, and results.  He will arrange followup    ASSESSMENT 1. [redacted] weeks gestation of pregnancy   2. Antepartum bleeding, first trimester   3. Missed abortion     PLAN Discharge home Bleeding precautions Rx Cytotec for missed abortion Rx Percocet for pain and zofran for nausea Followup in office   Follow-up Information     [facility], [facility] Schedule an appointment as soon as possible for a visit.   Contact information: [address] [place] [address] [phone]  Pt stable at time of  discharge. Encouraged to return here if she develops worsening of symptoms, increase in pain, fever, or other concerning symptoms.    Wynelle Bourgeois CNM, MSN Certified Nurse-Midwife 07/15/2023  9:56 PM
